# Patient Record
Sex: Female | Born: 1990 | Hispanic: Yes | Marital: Single | Smoking: Former smoker
Health system: Southern US, Community
[De-identification: ages and names within clinical notes are randomized; demographics above are authoritative.]

## PROBLEM LIST (undated history)

## (undated) ENCOUNTER — Inpatient Hospital Stay (HOSPITAL_COMMUNITY): Payer: Self-pay

## (undated) DIAGNOSIS — F4310 Post-traumatic stress disorder, unspecified: Secondary | ICD-10-CM

## (undated) DIAGNOSIS — F419 Anxiety disorder, unspecified: Secondary | ICD-10-CM

## (undated) DIAGNOSIS — F329 Major depressive disorder, single episode, unspecified: Secondary | ICD-10-CM

## (undated) DIAGNOSIS — F32A Depression, unspecified: Secondary | ICD-10-CM

## (undated) HISTORY — PX: SHOULDER SURGERY: SHX246

## (undated) HISTORY — PX: MULTIPLE TOOTH EXTRACTIONS: SHX2053

---

## 2017-01-14 ENCOUNTER — Inpatient Hospital Stay (HOSPITAL_COMMUNITY): Payer: Medicaid Other

## 2017-01-14 ENCOUNTER — Encounter (HOSPITAL_COMMUNITY): Payer: Self-pay

## 2017-01-14 ENCOUNTER — Inpatient Hospital Stay (HOSPITAL_COMMUNITY)
Admission: AD | Admit: 2017-01-14 | Discharge: 2017-01-15 | Disposition: A | Discharge status: Home / Self Care | Payer: Medicaid Other | Source: Ambulatory Visit | Attending: Family Medicine | Admitting: Family Medicine

## 2017-01-14 DIAGNOSIS — J219 Acute bronchiolitis, unspecified: Secondary | ICD-10-CM | POA: Insufficient documentation

## 2017-01-14 DIAGNOSIS — O99341 Other mental disorders complicating pregnancy, first trimester: Secondary | ICD-10-CM | POA: Diagnosis not present

## 2017-01-14 DIAGNOSIS — F4310 Post-traumatic stress disorder, unspecified: Secondary | ICD-10-CM | POA: Diagnosis not present

## 2017-01-14 DIAGNOSIS — E119 Type 2 diabetes mellitus without complications: Secondary | ICD-10-CM | POA: Diagnosis not present

## 2017-01-14 DIAGNOSIS — Z87891 Personal history of nicotine dependence: Secondary | ICD-10-CM | POA: Insufficient documentation

## 2017-01-14 DIAGNOSIS — O24111 Pre-existing diabetes mellitus, type 2, in pregnancy, first trimester: Secondary | ICD-10-CM | POA: Diagnosis not present

## 2017-01-14 DIAGNOSIS — Z888 Allergy status to other drugs, medicaments and biological substances status: Secondary | ICD-10-CM | POA: Insufficient documentation

## 2017-01-14 DIAGNOSIS — Z3A09 9 weeks gestation of pregnancy: Secondary | ICD-10-CM | POA: Diagnosis not present

## 2017-01-14 DIAGNOSIS — O99511 Diseases of the respiratory system complicating pregnancy, first trimester: Secondary | ICD-10-CM | POA: Diagnosis not present

## 2017-01-14 DIAGNOSIS — F329 Major depressive disorder, single episode, unspecified: Secondary | ICD-10-CM | POA: Diagnosis not present

## 2017-01-14 DIAGNOSIS — J218 Acute bronchiolitis due to other specified organisms: Secondary | ICD-10-CM

## 2017-01-14 DIAGNOSIS — Z9889 Other specified postprocedural states: Secondary | ICD-10-CM | POA: Insufficient documentation

## 2017-01-14 HISTORY — DX: Anxiety disorder, unspecified: F41.9

## 2017-01-14 HISTORY — DX: Major depressive disorder, single episode, unspecified: F32.9

## 2017-01-14 HISTORY — DX: Post-traumatic stress disorder, unspecified: F43.10

## 2017-01-14 HISTORY — DX: Depression, unspecified: F32.A

## 2017-01-14 LAB — CBC WITH DIFFERENTIAL/PLATELET
BASOS PCT: 0 %
Basophils Absolute: 0 10*3/uL (ref 0.0–0.1)
EOS ABS: 0.6 10*3/uL (ref 0.0–0.7)
Eosinophils Relative: 4 %
HCT: 40.8 % (ref 36.0–46.0)
Hemoglobin: 14.1 g/dL (ref 12.0–15.0)
Lymphocytes Relative: 25 %
Lymphs Abs: 3.6 10*3/uL (ref 0.7–4.0)
MCH: 27.8 pg (ref 26.0–34.0)
MCHC: 34.6 g/dL (ref 30.0–36.0)
MCV: 80.5 fL (ref 78.0–100.0)
MONO ABS: 0.4 10*3/uL (ref 0.1–1.0)
MONOS PCT: 2 %
NEUTROS PCT: 69 %
Neutro Abs: 9.9 10*3/uL — ABNORMAL HIGH (ref 1.7–7.7)
Platelets: 459 10*3/uL — ABNORMAL HIGH (ref 150–400)
RBC: 5.07 MIL/uL (ref 3.87–5.11)
RDW: 15.4 % (ref 11.5–15.5)
WBC: 14.5 10*3/uL — ABNORMAL HIGH (ref 4.0–10.5)

## 2017-01-14 LAB — COMPREHENSIVE METABOLIC PANEL
ALBUMIN: 3.6 g/dL (ref 3.5–5.0)
ALT: 40 U/L (ref 14–54)
ANION GAP: 8 (ref 5–15)
AST: 33 U/L (ref 15–41)
Alkaline Phosphatase: 84 U/L (ref 38–126)
BUN: 14 mg/dL (ref 6–20)
CO2: 24 mmol/L (ref 22–32)
Calcium: 10.2 mg/dL (ref 8.9–10.3)
Chloride: 104 mmol/L (ref 101–111)
Creatinine, Ser: 0.62 mg/dL (ref 0.44–1.00)
GFR calc Af Amer: >60 mL/min (ref >60)
GFR calc non Af Amer: >60 mL/min (ref >60)
GLUCOSE: 100 mg/dL — AB (ref 65–99)
POTASSIUM: 4 mmol/L (ref 3.5–5.1)
SODIUM: 136 mmol/L (ref 135–145)
TOTAL PROTEIN: 7.2 g/dL (ref 6.5–8.1)
Total Bilirubin: 0.1 mg/dL — ABNORMAL LOW (ref 0.3–1.2)

## 2017-01-14 MED ORDER — IPRATROPIUM-ALBUTEROL 0.5-2.5 (3) MG/3ML IN SOLN
3.0000 mL | Freq: Once | RESPIRATORY_TRACT | Status: AC
  Administered 2017-01-14: 3 mL via RESPIRATORY_TRACT
  Filled 2017-01-14: qty 3

## 2017-01-14 MED ORDER — IOPAMIDOL (ISOVUE-370) INJECTION 76%
100.0000 mL | Freq: Once | INTRAVENOUS | Status: AC | PRN
  Administered 2017-01-14: 100 mL via INTRAVENOUS

## 2017-01-14 NOTE — MAU Note (Signed)
Pt directly to triage with complaint of shortness of breath, states she has had a cough for the last 3 days and has felt progressively short of breath. Reports cough is productive of green sputum. Denies history of asthma. Also reports a sore throat.

## 2017-01-14 NOTE — MAU Provider Note (Signed)
History     CSN: [id number]  Arrival date and time: 01/14/17 2216   First Provider Initiated Contact with Patient 01/14/17 2227      Chief Complaint  Patient presents with  . Shortness of Breath   Denise Harmon is a 27 y.o. [phone] [id number] at Unknown who presents today with shortness of breath x 3 days. Worsening today. She states that she rode on [employer] bus [place] 5 [place]. She has been here for about 5 days at this time. The shortness of breath started 2 days after arrival here.    Shortness of Breath  This is a new problem. Episode onset: 3 days ago. Episode frequency: intermittently for 2 days, but today worsensing.  The problem has been gradually worsening. Pertinent negatives include no chest pain, fever, leg pain or leg swelling. Nothing aggravates the symptoms. Risk factors include prolonged immobilization and smoking (quit when she found out she was pregnant. ). She has tried nothing for the symptoms. (Possible allergies to dog. She is in a new house with dogs, and has been sneezing. )    Past Medical History:  Diagnosis Date  . Anxiety   . Depression   . Diabetes mellitus without complication (HCC)   . PTSD (post-traumatic stress disorder)     Past Surgical History:  Procedure Laterality Date  . CESAREAN SECTION    . MULTIPLE TOOTH EXTRACTIONS    . SHOULDER SURGERY      History reviewed. No pertinent family history.  Social History  Substance Use Topics  . Smoking status: Former Games developermoker  . Smokeless tobacco: Former NeurosurgeonUser  . Alcohol use No    Allergies:  Allergies  Allergen Reactions  . Naproxen Hives    No prescriptions prior to admission.    Review of Systems  Constitutional: Negative for chills and fever.  Respiratory: Positive for cough and shortness of breath.        Yesterday pain with breathing, but pain is less today.   Cardiovascular: Negative for chest pain and leg swelling.   Physical Exam   Blood pressure 148/75,  pulse 114, temperature 98.9 F (37.2 C), temperature source Oral, resp. rate 24, height 5\' 5"  (1.651 m), weight (!) 336 lb (152.4 kg), last menstrual period 11/15/2016, SpO2 98 %.  Physical Exam  Nursing note and vitals reviewed. Constitutional: She is oriented to person, place, and time. She appears well-developed and well-nourished. She appears distressed.  Respiratory: She is in respiratory distress. She has wheezes.  GI: There is no tenderness. There is no rebound.  Musculoskeletal: She exhibits no edema or tenderness.  Neurological: She is alert and oriented to person, place, and time.  Skin: Skin is warm and dry. No erythema.  Psychiatric: She has a normal mood and affect.   Results for orders placed or performed during the hospital encounter of 01/14/17 (from the past 24 hour(s))  CBC with Differential/Platelet     Status: Abnormal   Collection Time: 01/14/17 10:43 PM  Result Value Ref Range   WBC 14.5 (H) 4.0 - 10.5 K/uL   RBC 5.07 3.87 - 5.11 MIL/uL   Hemoglobin 14.1 12.0 - 15.0 g/dL   HCT 62.940.8 52.836.0 - 41.346.0 %   MCV 80.5 78.0 - 100.0 fL   MCH 27.8 26.0 - 34.0 pg   MCHC 34.6 30.0 - 36.0 g/dL   RDW 24.415.4 01.011.5 - 27.215.5 %   Platelets 459 (H) 150 - 400 K/uL   Neutrophils Relative % 69 %  Neutro Abs 9.9 (H) 1.7 - 7.7 K/uL   Lymphocytes Relative 25 %   Lymphs Abs 3.6 0.7 - 4.0 K/uL   Monocytes Relative 2 %   Monocytes Absolute 0.4 0.1 - 1.0 K/uL   Eosinophils Relative 4 %   Eosinophils Absolute 0.6 0.0 - 0.7 K/uL   Basophils Relative 0 %   Basophils Absolute 0.0 0.0 - 0.1 K/uL  Comprehensive metabolic panel     Status: Abnormal   Collection Time: 01/14/17 10:43 PM  Result Value Ref Range   Sodium 136 135 - 145 mmol/L   Potassium 4.0 3.5 - 5.1 mmol/L   Chloride 104 101 - 111 mmol/L   CO2 24 22 - 32 mmol/L   Glucose, Bld 100 (H) 65 - 99 mg/dL   BUN 14 6 - 20 mg/dL   Creatinine, Ser 1.61 0.44 - 1.00 mg/dL   Calcium 09.6 8.9 - 04.5 mg/dL   Total Protein 7.2 6.5 - 8.1 g/dL    Albumin 3.6 3.5 - 5.0 g/dL   AST 33 15 - 41 U/L   ALT 40 14 - 54 U/L   Alkaline Phosphatase 84 38 - 126 U/L   Total Bilirubin 0.1 (L) 0.3 - 1.2 mg/dL   GFR calc non Af Amer >60 >60 mL/min   GFR calc Af Amer >60 >60 mL/min   Anion gap 8 5 - 15  Pregnancy, urine POC     Status: Abnormal   Collection Time: 01/15/17 12:09 AM  Result Value Ref Range   Preg Test, Ur POSITIVE (A) NEGATIVE  Ct Angio Chest Pe W And/or Wo Contrast  Result Date: 01/14/2017 CLINICAL DATA:  27 y/o F; 2 months pregnant. Three days of shortness of breath. EXAM: CT ANGIOGRAPHY CHEST WITH CONTRAST TECHNIQUE: Multidetector CT imaging of the chest was performed using the standard protocol during bolus administration of intravenous contrast. Multiplanar CT image reconstructions and MIPs were obtained to evaluate the vascular anatomy. CONTRAST:  100 cc Isovue 370. COMPARISON:  None. FINDINGS: Cardiovascular: Satisfactory opacification of the pulmonary arteries to the segmental level. No evidence of pulmonary embolism. Normal heart size. No pericardial effusion. Mediastinum/Nodes: No enlarged mediastinal, hilar, or axillary lymph nodes. Thyroid gland, trachea, and esophagus demonstrate no significant findings. Lungs/Pleura: There is diffuse peribronchial thickening and patchy ground-glass opacities in the right upper lobe apical segment in a bronchovascular distribution with few additional minor ground-glass opacities in the lung bases. No pleural effusion or pneumothorax. Upper Abdomen: No acute abnormality. Musculoskeletal: No chest wall abnormality. No acute or significant osseous findings. Review of the MIP images confirms the above findings. IMPRESSION: 1. No pulmonary embolus identified. 2. Diffuse peribronchial thickening and patchy ground-glass opacities in the right upper lobe apical segment probably represents acute bronchitis with associated atelectasis or developing bronchopneumonia. Electronically Signed   By: Mitzi Hansen M.D.   On: 01/14/2017 23:44    MAU Course  Procedures  MDM  2240: DW Dr. Adrian Blackwater, will get CBC, CMET and CTA.  Patient has had breathing treatment. She reports that she is feeling a little better at this time.  0011: D/W Dr. Adrian Blackwater. Will DC home with albuterol, steroids and azithromycin.  Assessment and Plan   1. Acute bronchiolitis due to other specified organisms    DC home Comfort measures reviewed  1st Trimester precautions  RX: medrol 40mg  QD x 5 days, azithromycin as directed.  Return to MAU as needed FU with OB as planned  Follow-up Information    [facility] Follow up.   Specialty:  Urgent Care Contact information: [address] [place] [address] [phone]         Tawnya Crook 01/15/2017, 12:20 AM

## 2017-01-15 DIAGNOSIS — J218 Acute bronchiolitis due to other specified organisms: Secondary | ICD-10-CM

## 2017-01-15 LAB — URINALYSIS, ROUTINE W REFLEX MICROSCOPIC
BILIRUBIN URINE: NEGATIVE
Glucose, UA: NEGATIVE mg/dL
Hgb urine dipstick: NEGATIVE
Ketones, ur: NEGATIVE mg/dL
Nitrite: NEGATIVE
PH: 5 (ref 5.0–8.0)
Protein, ur: NEGATIVE mg/dL
SPECIFIC GRAVITY, URINE: 1.028 (ref 1.005–1.030)

## 2017-01-15 LAB — POCT PREGNANCY, URINE: Preg Test, Ur: POSITIVE — AB

## 2017-01-15 MED ORDER — AZITHROMYCIN 250 MG PO TABS: 250.0000 mg | ORAL_TABLET | Freq: Every day | ORAL | 0 refills | Status: DC

## 2017-01-15 MED ORDER — IPRATROPIUM-ALBUTEROL 20-100 MCG/ACT IN AERS
1.0000 | INHALATION_SPRAY | Freq: Once | RESPIRATORY_TRACT | Status: AC
  Administered 2017-01-15: 1 via RESPIRATORY_TRACT
  Filled 2017-01-15: qty 4

## 2017-01-15 MED ORDER — METHYLPREDNISOLONE 32 MG PO TABS
40.0000 mg | ORAL_TABLET | Freq: Once | ORAL | Status: AC
  Administered 2017-01-15: 40 mg via ORAL
  Filled 2017-01-15: qty 2

## 2017-01-15 MED ORDER — METHYLPREDNISOLONE 8 MG PO TABS: 40.0000 mg | ORAL_TABLET | Freq: Every day | ORAL | 0 refills | Status: DC

## 2017-01-15 NOTE — Discharge Instructions (Signed)
Prenatal Care [facility]    [facility]  [phone]     Phone: [phone]          [facility]                      [facility]  @[email or web address]     Phone: [phone]  Phone: [phone]         [facility]     Phone: [phone]  Phone: [phone]           [facility] @ [place]                hone: [phone]  Phone: [phone]         [facility] Dr. Francoise Ceo      Phone: [phone]  Phone: [phone]         [facility] Dept.                Phone: [phone]  [facility]   [phone]    [facility] [address] [place])          Phone: [phone] [facility]   Phone: [phone] Safe Medications in Pregnancy   Acne: Benzoyl Peroxide Salicylic Acid  Backache/Headache: Tylenol: 2 regular strength every 4 hours OR              2 Extra strength every 6 hours  Colds/Coughs/Allergies: Benadryl (alcohol free) 25 mg every 6 hours as needed Breath right strips Claritin Cepacol throat lozenges Chloraseptic throat spray Cold-Eeze- up to three times per day Cough drops, alcohol free Flonase (by prescription only) Guaifenesin Mucinex Robitussin DM (plain only, alcohol free) Saline nasal spray/drops Sudafed (pseudoephedrine) & Actifed ** use only after [redacted] weeks gestation and if you do not have high blood pressure Tylenol Vicks Vaporub Zinc lozenges Zyrtec   Constipation: Colace Ducolax suppositories Fleet enema Glycerin suppositories Metamucil Milk of magnesia Miralax Senokot Smooth move tea  Diarrhea: Kaopectate Imodium A-D  *NO pepto Bismol  Hemorrhoids: Anusol Anusol HC Preparation H Tucks  Indigestion: Tums Maalox Mylanta Zantac  Pepcid  Insomnia: Benadryl (alcohol free) 25mg  every 6 hours as needed Tylenol  PM Unisom, no Gelcaps  Leg Cramps: Tums MagGel  Nausea/Vomiting:  Bonine Dramamine Emetrol Ginger extract Sea bands Meclizine  Nausea medication to take during pregnancy:  Unisom (doxylamine succinate 25 mg tablets) Take one tablet daily at bedtime. If symptoms are not adequately controlled, the dose can be increased to a maximum recommended dose of two tablets daily (1/2 tablet in the morning, 1/2 tablet mid-afternoon and one at bedtime). Vitamin B6 100mg  tablets. Take one tablet twice a day (up to 200 mg per day).  Skin Rashes: Aveeno products Benadryl cream or 25mg  every 6 hours as needed Calamine Lotion 1% cortisone cream  Yeast infection: Gyne-lotrimin 7 Monistat 7   **If taking multiple medications, please check labels to avoid duplicating the same active ingredients **take medication as directed on the label ** Do not exceed 4000 mg of tylenol in 24 hours **Do not take medications that contain aspirin or ibuprofen      Viral Respiratory Infection A respiratory infection is an illness that affects part of the respiratory system, such as the lungs, nose, or throat. Most respiratory infections are caused by either viruses or bacteria. A respiratory infection that is caused by a  virus is called a viral respiratory infection. Common types of viral respiratory infections include:  A cold.  The flu (influenza).  A respiratory syncytial virus (RSV) infection. How do I know if I have a viral respiratory infection? Most viral respiratory infections cause:  A stuffy or runny nose.  Yellow or green nasal discharge.  A cough.  Sneezing.  Fatigue.  Achy muscles.  A sore throat.  Sweating or chills.  A fever.  A headache. How are viral respiratory infections treated? If influenza is diagnosed early, it may be treated with an antiviral medicine that shortens the length of time a person has symptoms. Symptoms of viral respiratory infections may be treated  with over-the-counter and prescription medicines, such as:  Expectorants. These make it easier to cough up mucus.  Decongestant nasal sprays. Health care providers do not prescribe antibiotic medicines for viral infections. This is because antibiotics are designed to kill bacteria. They have no effect on viruses. How do I know if I should stay home from work or school? To avoid exposing others to your respiratory infection, stay home if you have:  A fever.  A persistent cough.  A sore throat.  A runny nose.  Sneezing.  Muscles aches.  Headaches.  Fatigue.  Weakness.  Chills.  Sweating.  Nausea. Follow these instructions at home:  Rest as much as possible.  Take over-the-counter and prescription medicines only as told by your health care provider.  Drink enough fluid to keep your urine clear or pale yellow. This helps prevent dehydration and helps loosen up mucus.  Gargle with a salt-water mixture 3-4 times per day or as needed. To make a salt-water mixture, completely dissolve -1 tsp of salt in 1 cup of warm water.  Use nose drops made from salt water to ease congestion and soften raw skin around your nose.  Do not drink alcohol.  Do not use tobacco products, including cigarettes, chewing tobacco, and e-cigarettes. If you need help quitting, ask your health care provider. Contact a health care provider if:  Your symptoms last for 10 days or longer.  Your symptoms get worse over time.  You have a fever.  You have severe sinus pain in your face or forehead.  The glands in your jaw or neck become very swollen. Get help right away if:  You feel pain or pressure in your chest.  You have shortness of breath.  You faint or feel like you will faint.  You have severe and persistent vomiting.  You feel confused or disoriented. This information is not intended to replace advice given to you by your health care provider. Make sure you discuss any questions  you have with your health care provider. Document Released: 09/26/2005 Document Revised: 05/24/2016 Document Reviewed: 05/25/2015 Elsevier Interactive Patient Education  2017 [employer]

## 2017-01-23 ENCOUNTER — Inpatient Hospital Stay (HOSPITAL_COMMUNITY)
Admission: EM | Admit: 2017-01-23 | Discharge: 2017-01-29 | DRG: 781 | Disposition: A | Discharge status: Home / Self Care | Payer: Medicaid Other | Attending: Internal Medicine | Admitting: Internal Medicine

## 2017-01-23 ENCOUNTER — Emergency Department (HOSPITAL_COMMUNITY): Payer: Medicaid Other

## 2017-01-23 DIAGNOSIS — E872 Acidosis: Secondary | ICD-10-CM | POA: Diagnosis present

## 2017-01-23 DIAGNOSIS — O365910 Maternal care for other known or suspected poor fetal growth, first trimester, not applicable or unspecified: Secondary | ICD-10-CM | POA: Diagnosis present

## 2017-01-23 DIAGNOSIS — Z3A11 11 weeks gestation of pregnancy: Secondary | ICD-10-CM

## 2017-01-23 DIAGNOSIS — K5900 Constipation, unspecified: Secondary | ICD-10-CM | POA: Diagnosis present

## 2017-01-23 DIAGNOSIS — O98811 Other maternal infectious and parasitic diseases complicating pregnancy, first trimester: Principal | ICD-10-CM | POA: Diagnosis present

## 2017-01-23 DIAGNOSIS — R58 Hemorrhage, not elsewhere classified: Secondary | ICD-10-CM | POA: Diagnosis not present

## 2017-01-23 DIAGNOSIS — O99211 Obesity complicating pregnancy, first trimester: Secondary | ICD-10-CM | POA: Diagnosis present

## 2017-01-23 DIAGNOSIS — O24419 Gestational diabetes mellitus in pregnancy, unspecified control: Secondary | ICD-10-CM | POA: Diagnosis present

## 2017-01-23 DIAGNOSIS — Z825 Family history of asthma and other chronic lower respiratory diseases: Secondary | ICD-10-CM

## 2017-01-23 DIAGNOSIS — A419 Sepsis, unspecified organism: Secondary | ICD-10-CM

## 2017-01-23 DIAGNOSIS — J189 Pneumonia, unspecified organism: Secondary | ICD-10-CM | POA: Diagnosis present

## 2017-01-23 DIAGNOSIS — E876 Hypokalemia: Secondary | ICD-10-CM | POA: Diagnosis present

## 2017-01-23 DIAGNOSIS — Z6841 Body Mass Index (BMI) 40.0 and over, adult: Secondary | ICD-10-CM | POA: Diagnosis not present

## 2017-01-23 DIAGNOSIS — J40 Bronchitis, not specified as acute or chronic: Secondary | ICD-10-CM | POA: Diagnosis not present

## 2017-01-23 DIAGNOSIS — O30041 Twin pregnancy, dichorionic/diamniotic, first trimester: Secondary | ICD-10-CM | POA: Diagnosis present

## 2017-01-23 DIAGNOSIS — J9601 Acute respiratory failure with hypoxia: Secondary | ICD-10-CM | POA: Diagnosis present

## 2017-01-23 DIAGNOSIS — Z3490 Encounter for supervision of normal pregnancy, unspecified, unspecified trimester: Secondary | ICD-10-CM

## 2017-01-23 DIAGNOSIS — D6489 Other specified anemias: Secondary | ICD-10-CM | POA: Diagnosis present

## 2017-01-23 DIAGNOSIS — O99011 Anemia complicating pregnancy, first trimester: Secondary | ICD-10-CM | POA: Diagnosis present

## 2017-01-23 DIAGNOSIS — E669 Obesity, unspecified: Secondary | ICD-10-CM | POA: Diagnosis present

## 2017-01-23 DIAGNOSIS — O99511 Diseases of the respiratory system complicating pregnancy, first trimester: Secondary | ICD-10-CM | POA: Diagnosis present

## 2017-01-23 LAB — BASIC METABOLIC PANEL
Anion gap: 7 (ref 5–15)
BUN: 9 mg/dL (ref 6–20)
CALCIUM: 8.6 mg/dL — AB (ref 8.9–10.3)
CO2: 22 mmol/L (ref 22–32)
Chloride: 105 mmol/L (ref 101–111)
Creatinine, Ser: 0.54 mg/dL (ref 0.44–1.00)
Glucose, Bld: 125 mg/dL — ABNORMAL HIGH (ref 65–99)
Potassium: 3.4 mmol/L — ABNORMAL LOW (ref 3.5–5.1)
Sodium: 134 mmol/L — ABNORMAL LOW (ref 135–145)

## 2017-01-23 LAB — CBC
HEMATOCRIT: 36.8 % (ref 36.0–46.0)
Hemoglobin: 12.2 g/dL (ref 12.0–15.0)
MCH: 26.8 pg (ref 26.0–34.0)
MCHC: 33.2 g/dL (ref 30.0–36.0)
MCV: 80.7 fL (ref 78.0–100.0)
PLATELETS: 354 10*3/uL (ref 150–400)
RBC: 4.56 MIL/uL (ref 3.87–5.11)
RDW: 15.1 % (ref 11.5–15.5)
WBC: 22 10*3/uL — AB (ref 4.0–10.5)

## 2017-01-23 LAB — I-STAT TROPONIN, ED: Troponin i, poc: 0 ng/mL (ref 0.00–0.08)

## 2017-01-23 LAB — I-STAT BETA HCG BLOOD, ED (MC, WL, AP ONLY): I-stat hCG, quantitative: >2000 m[IU]/mL — ABNORMAL HIGH (ref <5)

## 2017-01-23 MED ORDER — DEXTROSE 5 % IV SOLN
1.0000 g | Freq: Once | INTRAVENOUS | Status: AC
  Administered 2017-01-23: 1 g via INTRAVENOUS
  Filled 2017-01-23: qty 10

## 2017-01-23 MED ORDER — METHYLPREDNISOLONE SODIUM SUCC 125 MG IJ SOLR
60.0000 mg | Freq: Once | INTRAMUSCULAR | Status: AC
  Administered 2017-01-23: 60 mg via INTRAVENOUS
  Filled 2017-01-23: qty 2

## 2017-01-23 MED ORDER — SODIUM CHLORIDE 0.9 % IV BOLUS (SEPSIS)
1000.0000 mL | Freq: Once | INTRAVENOUS | Status: AC
  Administered 2017-01-23: 1000 mL via INTRAVENOUS

## 2017-01-23 MED ORDER — ALBUTEROL (5 MG/ML) CONTINUOUS INHALATION SOLN
10.0000 mg/h | INHALATION_SOLUTION | RESPIRATORY_TRACT | Status: DC
  Administered 2017-01-23: 10 mg/h via RESPIRATORY_TRACT

## 2017-01-23 MED ORDER — ALBUTEROL SULFATE (2.5 MG/3ML) 0.083% IN NEBU
5.0000 mg | INHALATION_SOLUTION | Freq: Once | RESPIRATORY_TRACT | Status: AC
  Administered 2017-01-23: 5 mg via RESPIRATORY_TRACT
  Filled 2017-01-23: qty 6

## 2017-01-23 MED ORDER — ALBUTEROL (5 MG/ML) CONTINUOUS INHALATION SOLN
INHALATION_SOLUTION | RESPIRATORY_TRACT | Status: AC
  Filled 2017-01-23: qty 20

## 2017-01-23 MED ORDER — SODIUM CHLORIDE 0.9 % IV SOLN
INTRAVENOUS | Status: DC
  Administered 2017-01-24 (×2): via INTRAVENOUS
  Administered 2017-01-25: 100 mL/h via INTRAVENOUS

## 2017-01-23 NOTE — ED Provider Notes (Signed)
[facility]-EMERGENCY DEPT Provider Note   CSN: [id number] Arrival date & time: 01/23/17  1943     History   Chief Complaint Chief Complaint  Patient presents with  . Shortness of Breath    HPI Denise Harmon is a 27 y.o. female.  HPI Patient presents with shortness of breath. About a week ago [facility] Had a CT angiography at that time since she is around [redacted] weeks pregnant. Did not fill the antibiotics for the steroids. Had never gotten a whole lot better.last night got worse. Has had mild asthma in the past but never had to come to the hospital for it. Has had a cough with some green sputum. Slight chest pain. Has had myalgias. No swelling in her legs.   Past Medical History:  Diagnosis Date  . Anxiety   . Depression   . Diabetes mellitus without complication (HCC)   . PTSD (post-traumatic stress disorder)     There are no active problems to display for this patient.   Past Surgical History:  Procedure Laterality Date  . CESAREAN SECTION    . MULTIPLE TOOTH EXTRACTIONS    . SHOULDER SURGERY      OB History    Gravida Para Term Preterm AB Living   5 3 3   1 3    SAB TAB Ectopic Multiple Live Births   1       3       Home Medications    Prior to Admission medications   Medication Sig Start Date End Date Taking? Authorizing Provider  albuterol (PROVENTIL HFA;VENTOLIN HFA) 108 (90 Base) MCG/ACT inhaler Inhale 1-2 puffs into the lungs every 6 (six) hours as needed for wheezing or shortness of breath.   Yes Historical Provider, MD  guaiFENesin-dextromethorphan (ROBITUSSIN DM) 100-10 MG/5ML syrup Take 15 mLs by mouth every 4 (four) hours as needed for cough.   Yes Historical Provider, MD  Multiple Vitamins-Minerals (ADULT GUMMY) CHEW Chew 2 tablets by mouth daily.   Yes Historical Provider, MD  azithromycin (ZITHROMAX) 250 MG tablet Take 1 tablet (250 mg total) by mouth daily. Take first 2 tablets together, then 1 every day  until finished. Patient not taking: Reported on 01/23/2017 01/15/17   Armando ReichertHeather D Hogan, CNM  methylPREDNISolone (MEDROL) 8 MG tablet Take 5 tablets (40 mg total) by mouth daily. For 5 days Patient not taking: Reported on 01/23/2017 01/15/17   Armando ReichertHeather D Hogan, CNM    Family History No family history on file.  Social History Social History  Substance Use Topics  . Smoking status: Former Games developermoker  . Smokeless tobacco: Former NeurosurgeonUser  . Alcohol use No     Allergies   Naproxen   Review of Systems Review of Systems  Constitutional: Negative for appetite change.  HENT: Negative for congestion.   Respiratory: Positive for cough, shortness of breath and wheezing.   Cardiovascular: Positive for chest pain.  Gastrointestinal: Negative for abdominal pain.  Genitourinary: Negative for flank pain.  Musculoskeletal: Negative for back pain.  Skin: Negative for wound.  Neurological: Negative for weakness and numbness.  Hematological: Negative for adenopathy.  Psychiatric/Behavioral: Negative for confusion.     Physical Exam Updated Vital Signs BP (!) 136/54 (BP Location: Right Arm)   Pulse (!) 129   Temp 98.4 F (36.9 C) (Oral)   Resp (!) 34   LMP 11/15/2016   SpO2 91%   Physical Exam  Constitutional: She appears well-developed.  HENT:  Head: Normocephalic.  Eyes: Pupils are equal, round, and reactive to light.  Neck: No JVD present.  Cardiovascular:  Tachycardia  Pulmonary/Chest:  Diffuse harsh wheezes. Prolonged expirations. Mild respiratory distress.  Abdominal: Soft. She exhibits mass.  Musculoskeletal: She exhibits no edema.  Neurological: She is alert.  Skin: Skin is warm. Capillary refill takes less than 2 seconds.  Psychiatric: She has a normal mood and affect.     ED Treatments / Results  Labs (all labs ordered are listed, but only abnormal results are displayed) Labs Reviewed  CBC - Abnormal; Notable for the following:       Result Value   WBC 22.0 (*)    All  other components within normal limits  BASIC METABOLIC PANEL - Abnormal; Notable for the following:    Sodium 134 (*)    Potassium 3.4 (*)    Glucose, Bld 125 (*)    Calcium 8.6 (*)    All other components within normal limits  I-STAT BETA HCG BLOOD, ED ([facility], [facility], [facility] ONLY) - Abnormal; Notable for the following:    I-stat hCG, quantitative >2,000.0 (*)    All other components within normal limits  I-STAT TROPOININ, ED    EKG  EKG Interpretation  Date/Time:  Wednesday January 23 2017 20:16:18 EST Ventricular Rate:  133 PR Interval:    QRS Duration: 108 QT Interval:  312 QTC Calculation: 465 R Axis:   79 Text Interpretation:  Sinus tachycardia Confirmed by Rubin Payor  MD, Harrold Donath [phone]) on 01/23/2017 9:00:31 PM       Radiology Dg Chest 2 View  Result Date: 01/23/2017 CLINICAL DATA:  Initial evaluation for acute shortness of breath. EXAM: CHEST  2 VIEW COMPARISON:  Prior CT from 01/14/2017. FINDINGS: Cardiac and mediastinal silhouettes are stable in size and contour, and remain within normal limits. Lungs normally inflated. Streaky opacities with medial right upper lobe seen, similar to previous, which may reflect atelectasis and/ or scarring. Superimposed persistent pneumonia not excluded. No other focal airspace disease. No pulmonary edema or pleural effusion. No pneumothorax. No acute osseous abnormality. IMPRESSION: Persistent streaky opacity within the medial right upper lobe, which may reflect atelectasis and/ or scarring. Superimposed bronchopneumonia is not excluded, and could be considered in the correct clinical setting. Electronically Signed   By: Rise Mu M.D.   On: 01/23/2017 21:56    Procedures Procedures (including critical care time)  Medications Ordered in ED Medications  albuterol (PROVENTIL,VENTOLIN) solution continuous neb (10 mg/hr Nebulization New Bag/Given 01/23/17 2009)  albuterol (PROVENTIL, VENTOLIN) (5 MG/ML) 0.5% continuous inhalation solution (   Not Given 01/23/17 2009)  albuterol (PROVENTIL) (2.5 MG/3ML) 0.083% nebulizer solution 5 mg (not administered)  cefTRIAXone (ROCEPHIN) 1 g in dextrose 5 % 50 mL IVPB (1 g Intravenous New Bag/Given 01/23/17 2255)  sodium chloride 0.9 % bolus 1,000 mL (1,000 mLs Intravenous New Bag/Given 01/23/17 2253)  methylPREDNISolone sodium succinate (SOLU-MEDROL) 125 mg/2 mL injection 60 mg (60 mg Intravenous Given 01/23/17 2049)     Initial Impression / Assessment and Plan / ED Course  I have reviewed the triage vital signs and the nursing notes.  Pertinent labs & imaging results that were available during my care of the patient were reviewed by me and considered in my medical decision making (see chart for details).     Patient with shortness of breath. Seen at [facility] for the same previously and diagnosed with bronchitis. Do not get steroids or antibiotics filled. Now worsening symptoms. X-ray shows possible pneumonia. Continued dyspnea and tachycardia  will admit to internal medicine. Still dyspneic.  CRITICAL CARE Performed by: Billee Cashing Total critical care time30 minutes Critical care time was exclusive of separately billable procedures and treating other patients. Critical care was necessary to treat or prevent imminent or life-threatening deterioration. Critical care was time spent personally by me on the following activities: development of treatment plan with patient and/or surrogate as well as nursing, discussions with consultants, evaluation of patient's response to treatment, examination of patient, obtaining history from patient or surrogate, ordering and performing treatments and interventions, ordering and review of laboratory studies, ordering and review of radiographic studies, pulse oximetry and re-evaluation of patient's condition.   Final Clinical Impressions(s) / ED Diagnoses   Final diagnoses:  Community acquired pneumonia, unspecified laterality  Pregnancy,  unspecified gestational age    [place] Prescriptions New Prescriptions   No medications on file     Benjiman Core, MD 01/23/17 2258

## 2017-01-23 NOTE — H&P (Signed)
History and Physical    Denise Harmon ZOX:[id number] DOB: 07-07-1990 DOA: 01/23/2017  Referring MD/NP/PA:   PCP: No primary care provider on file.   Patient coming from:  The patient is coming from home.  At baseline, pt is independent for most of ADL.  Chief Complaint: Shortness of breath, cough, wheezing  HPI: Denise Harmon is a 27 y.o. female with medical history significant of 11 week pregnancy, gestational diabetes mellitus, depression, anxiety, who presents with shortness of breath, wheezing and cough.  Patient states that she has been having cough, shortness of breath and wheezing for 8 days. She was seen one week ago, and had negative CT angiogram for PE. She was diagnosed as bronchitis, and given prescription for steroid azithromycin. Patient has not picked up the prescription due to lack of insurance. Her symptom has been progressively getting worse. She coughs up greenish colored sputum. She has pleuritic chest pain, which is located in front chest, constant, 6 out of 10 in severity, radiating to the shoulders and the neck. Patient has nausea, but no vomiting, diarrhea or abdominal pain. Patient is [redacted] weeks pregnant, no vaginal bleeding or discharge. No symptoms of UTI or unilateral weakness.  ED Course: pt was found to have WBC 22, lactic acid 3.8, potassium is 3.4, creatinine normal, negative troponin, negative flu PCR, temperature 98.4, tachycardia, tachypnea, oxygen saturation 91% on room air. Chest x-ray showed streaks opacity. Pt is admitted to [facility] as inpt. Pt was given one dose of tamiflu before Flu PCR result came back negative.  Review of Systems:   General: no fevers, chills, no changes in body weight, has poor appetite, has fatigue HEENT: no blurry vision, hearing changes or sore throat Respiratory: has dyspnea, coughing, wheezing CV: no chest pain, no palpitations GI: has nausea, no vomiting, abdominal pain, diarrhea, constipation GU: no dysuria,  burning on urination, increased urinary frequency, hematuria  Ext: no leg edema Neuro: no unilateral weakness, numbness, or tingling, no vision change or hearing loss Skin: no rash, no skin tear. MSK: No muscle spasm, no deformity, no limitation of range of movement in spin Heme: No easy bruising.  Travel history: No recent long distant travel.  Allergy:  Allergies  Allergen Reactions  . Naproxen Hives    Past Medical History:  Diagnosis Date  . Anxiety   . Depression   . Diabetes mellitus without complication (HCC)   . PTSD (post-traumatic stress disorder)     Past Surgical History:  Procedure Laterality Date  . CESAREAN SECTION    . MULTIPLE TOOTH EXTRACTIONS    . SHOULDER SURGERY      Social History:  reports that she has quit smoking. She has quit using smokeless tobacco. She reports that she does not drink alcohol or use drugs.  Family History:  Family History  Problem Relation Age of Onset  . Diabetes Mellitus II Mother   . Diabetes Mellitus II Father   . Hypertension Father   . Asthma Sister      Prior to Admission medications   Medication Sig Start Date End Date Taking? Authorizing Provider  albuterol (PROVENTIL HFA;VENTOLIN HFA) 108 (90 Base) MCG/ACT inhaler Inhale 1-2 puffs into the lungs every 6 (six) hours as needed for wheezing or shortness of breath.   Yes Historical Provider, MD  guaiFENesin-dextromethorphan (ROBITUSSIN DM) 100-10 MG/5ML syrup Take 15 mLs by mouth every 4 (four) hours as needed for cough.   Yes Historical Provider, MD  Multiple Vitamins-Minerals (ADULT GUMMY) CHEW Chew 2 tablets  by mouth daily.   Yes Historical Provider, MD  azithromycin (ZITHROMAX) 250 MG tablet Take 1 tablet (250 mg total) by mouth daily. Take first 2 tablets together, then 1 every day until finished. Patient not taking: Reported on 01/23/2017 01/15/17   Armando ReichertHeather D Hogan, CNM  methylPREDNISolone (MEDROL) 8 MG tablet Take 5 tablets (40 mg total) by mouth daily. For 5  days Patient not taking: Reported on 01/23/2017 01/15/17   Armando ReichertHeather D Hogan, CNM    Physical Exam: Vitals:   01/24/17 0439 01/24/17 0445 01/24/17 0530 01/24/17 0533  BP:  140/71 (!) 129/51 122/69  Pulse:  107 101 100  Resp:    18  Temp:      TempSrc:      SpO2: 96% 93% 91% 93%   General: Not in acute distress HEENT:       Eyes: PERRL, EOMI, no scleral icterus.       ENT: No discharge from the ears and nose, no pharynx injection, no tonsillar enlargement.        Neck: No JVD, no bruit, no mass felt. Heme: No neck lymph node enlargement. Cardiac: S1/S2, RRR, No murmurs, No gallops or rubs. Respiratory: Has wheezing bilaterally. No rales or rubs. GI: Soft, nondistended, nontender, no rebound pain, no organomegaly, BS present. GU: No hematuria Ext: No pitting leg edema bilaterally. 2+DP/PT pulse bilaterally. Musculoskeletal: No joint deformities, No joint redness or warmth, no limitation of ROM in spin. Skin: No rashes.  Neuro: Alert, oriented X3, cranial nerves II-XII grossly intact, moves all extremities normally.  Psych: Patient is not psychotic, no suicidal or hemocidal ideation.  Labs on Admission: I have personally reviewed following labs and imaging studies  CBC:  Recent Labs Lab 01/23/17 1958  WBC 22.0*  HGB 12.2  HCT 36.8  MCV 80.7  PLT 354   Basic Metabolic Panel:  Recent Labs Lab 01/23/17 1958  NA 134*  K 3.4*  CL 105  CO2 22  GLUCOSE 125*  BUN 9  CREATININE 0.54  CALCIUM 8.6*   GFR: Estimated Creatinine Clearance: 163 mL/min (by C-G formula based on SCr of 0.54 mg/dL). Liver Function Tests: No results for input(s): AST, ALT, ALKPHOS, BILITOT, PROT, ALBUMIN in the last 168 hours. No results for input(s): LIPASE, AMYLASE in the last 168 hours. No results for input(s): AMMONIA in the last 168 hours. Coagulation Profile:  Recent Labs Lab 01/23/17 2355  INR 1.03   Cardiac Enzymes: No results for input(s): CKTOTAL, CKMB, CKMBINDEX, TROPONINI in  the last 168 hours. BNP (last 3 results) No results for input(s): PROBNP in the last 8760 hours. HbA1C: No results for input(s): HGBA1C in the last 72 hours. CBG: No results for input(s): GLUCAP in the last 168 hours. Lipid Profile: No results for input(s): CHOL, HDL, LDLCALC, TRIG, CHOLHDL, LDLDIRECT in the last 72 hours. Thyroid Function Tests: No results for input(s): TSH, T4TOTAL, FREET4, T3FREE, THYROIDAB in the last 72 hours. Anemia Panel: No results for input(s): VITAMINB12, FOLATE, FERRITIN, TIBC, IRON, RETICCTPCT in the last 72 hours. Urine analysis:    Component Value Date/Time   COLORURINE YELLOW 01/14/2017 2230   APPEARANCEUR HAZY (A) 01/14/2017 2230   LABSPEC 1.028 01/14/2017 2230   PHURINE 5.0 01/14/2017 2230   GLUCOSEU NEGATIVE 01/14/2017 2230   HGBUR NEGATIVE 01/14/2017 2230   BILIRUBINUR NEGATIVE 01/14/2017 2230   KETONESUR NEGATIVE 01/14/2017 2230   PROTEINUR NEGATIVE 01/14/2017 2230   NITRITE NEGATIVE 01/14/2017 2230   LEUKOCYTESUR SMALL (A) 01/14/2017 2230   Sepsis Labs: @  LABRCNTIP(procalcitonin:4,lacticidven:4) )No results found for this or any previous visit (from the past 240 hour(s)).   Radiological Exams on Admission: Dg Chest 2 View  Result Date: 01/23/2017 CLINICAL DATA:  Initial evaluation for acute shortness of breath. EXAM: CHEST  2 VIEW COMPARISON:  Prior CT from 01/14/2017. FINDINGS: Cardiac and mediastinal silhouettes are stable in size and contour, and remain within normal limits. Lungs normally inflated. Streaky opacities with medial right upper lobe seen, similar to previous, which may reflect atelectasis and/ or scarring. Superimposed persistent pneumonia not excluded. No other focal airspace disease. No pulmonary edema or pleural effusion. No pneumothorax. No acute osseous abnormality. IMPRESSION: Persistent streaky opacity within the medial right upper lobe, which may reflect atelectasis and/ or scarring. Superimposed bronchopneumonia is not  excluded, and could be considered in the correct clinical setting. Electronically Signed   By: Rise Mu M.D.   On: 01/23/2017 21:56     EKG: Independently reviewed.  Sinus tachycardia, QTC 465, no ischemic changes.  Assessment/Plan Principal Problem:   Acute respiratory failure with hypoxia (HCC) Active Problems:   CAP (community acquired pneumonia)   Bronchitis   Pregnancy   Hypokalemia   Sepsis (HCC)   Acute respiratory failure with hypoxia due to bronchitis versus CPAP and sepsis. Patient is septic with leukocytosis, elevated lactate, tachypnea, tachycardia. Currently hemodynamically stable. Flu PCR negative.   - Will admit to [facility] as inpt - IV Rocephin and azithromycin - Mucinex for cough  - prn Albuterol Nebs, atrovent for SOB - SoluMedrol 60 mg tid - Urine legionella and S. pneumococcal antigen - Follow up blood culture x2, sputum culture. - will get Procalcitonin and trend lactic acid level per sepsis protocol - IVF: 3L of NS bolus in ED, followed by 100 mL per hour of NS   Pregnancy: 11 week. No alarming symptoms or signs for abortion - Continue fetal heart rate monitoring every shift. - Prenatal vitamins  Hypokalemia: K=3.4 on admission. - Repleted   DVT ppx:  SQ Lovenox Code Status: Full code Family Communication: None at bed side.   Disposition Plan:  Anticipate discharge back to previous home environment Consults called:  none Admission status: SDU/inpation       Date of Service 01/24/2017    Lorretta Harp [facility] Pager [phone]  If 7PM-7AM, please contact night-coverage [email or web address] Password [facility] 01/24/2017, 6:19 AM

## 2017-01-23 NOTE — ED Triage Notes (Signed)
Per EMS pt started having shortness of breath around 3am and has been using her rescue inhaler without relief  Pt called EMS around 7pm  Upon arrival pt was having resp distress and using her accessory muscles to breathe  Pt had wheezing in all lobes  Pt was sen and diagnosed with bronchitis a week ago but was unable to get her prescriptions filled due to insurance problems  Pt was given albuterol 10mg /atrovent 0.5mg  and solumedrol 125mg  by EMS prior to arrival

## 2017-01-24 ENCOUNTER — Encounter (HOSPITAL_COMMUNITY): Payer: Self-pay | Admitting: Internal Medicine

## 2017-01-24 DIAGNOSIS — E876 Hypokalemia: Secondary | ICD-10-CM | POA: Diagnosis present

## 2017-01-24 DIAGNOSIS — J40 Bronchitis, not specified as acute or chronic: Secondary | ICD-10-CM | POA: Diagnosis present

## 2017-01-24 DIAGNOSIS — Z3490 Encounter for supervision of normal pregnancy, unspecified, unspecified trimester: Secondary | ICD-10-CM

## 2017-01-24 DIAGNOSIS — A419 Sepsis, unspecified organism: Secondary | ICD-10-CM

## 2017-01-24 DIAGNOSIS — J9601 Acute respiratory failure with hypoxia: Secondary | ICD-10-CM

## 2017-01-24 LAB — EXPECTORATED SPUTUM ASSESSMENT W GRAM STAIN, RFLX TO RESP C

## 2017-01-24 LAB — PROTIME-INR
INR: 1.03
Prothrombin Time: 13.5 seconds (ref 11.4–15.2)

## 2017-01-24 LAB — LACTIC ACID, PLASMA
LACTIC ACID, VENOUS: 1 mmol/L (ref 0.5–1.9)
LACTIC ACID, VENOUS: 3.4 mmol/L — AB (ref 0.5–1.9)
LACTIC ACID, VENOUS: 3.8 mmol/L — AB (ref 0.5–1.9)

## 2017-01-24 LAB — PROCALCITONIN: Procalcitonin: <0.1 ng/mL

## 2017-01-24 LAB — APTT: aPTT: 22 seconds — ABNORMAL LOW (ref 24–36)

## 2017-01-24 LAB — EXPECTORATED SPUTUM ASSESSMENT W REFEX TO RESP CULTURE

## 2017-01-24 LAB — INFLUENZA PANEL BY PCR (TYPE A & B)
Influenza A By PCR: NEGATIVE
Influenza B By PCR: NEGATIVE

## 2017-01-24 LAB — HIV ANTIBODY (ROUTINE TESTING W REFLEX): HIV Screen 4th Generation wRfx: NONREACTIVE

## 2017-01-24 LAB — GLUCOSE, CAPILLARY: GLUCOSE-CAPILLARY: 130 mg/dL — AB (ref 65–99)

## 2017-01-24 LAB — STREP PNEUMONIAE URINARY ANTIGEN: STREP PNEUMO URINARY ANTIGEN: NEGATIVE

## 2017-01-24 LAB — MRSA PCR SCREENING: MRSA BY PCR: NEGATIVE

## 2017-01-24 MED ORDER — LEVALBUTEROL HCL 1.25 MG/0.5ML IN NEBU
INHALATION_SOLUTION | RESPIRATORY_TRACT | Status: AC
Start: 2017-01-24 — End: 2017-01-24
  Filled 2017-01-24: qty 0.5

## 2017-01-24 MED ORDER — METHYLPREDNISOLONE SODIUM SUCC 125 MG IJ SOLR
60.0000 mg | Freq: Three times a day (TID) | INTRAMUSCULAR | Status: DC
  Administered 2017-01-24 – 2017-01-26 (×7): 60 mg via INTRAVENOUS
  Filled 2017-01-24 (×7): qty 2

## 2017-01-24 MED ORDER — DEXTROSE 5 % IV SOLN
500.0000 mg | INTRAVENOUS | Status: DC
  Administered 2017-01-24 – 2017-01-28 (×5): 500 mg via INTRAVENOUS
  Filled 2017-01-24 (×6): qty 500

## 2017-01-24 MED ORDER — CENTRUM PO CHEW
2.0000 | CHEWABLE_TABLET | Freq: Every day | ORAL | Status: DC
  Administered 2017-01-24 – 2017-01-28 (×4): 2 via ORAL
  Filled 2017-01-24 (×7): qty 2

## 2017-01-24 MED ORDER — CEFTRIAXONE SODIUM 1 G IJ SOLR
1.0000 g | INTRAMUSCULAR | Status: DC
  Administered 2017-01-24 – 2017-01-27 (×4): 1 g via INTRAVENOUS
  Filled 2017-01-24 (×5): qty 10

## 2017-01-24 MED ORDER — IPRATROPIUM BROMIDE 0.02 % IN SOLN
0.5000 mg | RESPIRATORY_TRACT | Status: DC
  Administered 2017-01-24 – 2017-01-25 (×9): 0.5 mg via RESPIRATORY_TRACT
  Filled 2017-01-24 (×8): qty 2.5

## 2017-01-24 MED ORDER — ENOXAPARIN SODIUM 40 MG/0.4ML ~~LOC~~ SOLN
40.0000 mg | Freq: Every day | SUBCUTANEOUS | Status: DC
  Administered 2017-01-24 – 2017-01-27 (×4): 40 mg via SUBCUTANEOUS
  Filled 2017-01-24 (×5): qty 0.4

## 2017-01-24 MED ORDER — LEVALBUTEROL HCL 1.25 MG/0.5ML IN NEBU
1.2500 mg | INHALATION_SOLUTION | RESPIRATORY_TRACT | Status: DC
  Administered 2017-01-24 – 2017-01-25 (×7): 1.25 mg via RESPIRATORY_TRACT
  Filled 2017-01-24 (×6): qty 0.5

## 2017-01-24 MED ORDER — OSELTAMIVIR PHOSPHATE 75 MG PO CAPS
75.0000 mg | ORAL_CAPSULE | Freq: Once | ORAL | Status: AC
  Administered 2017-01-24: 75 mg via ORAL
  Filled 2017-01-24: qty 1

## 2017-01-24 MED ORDER — PROMETHAZINE HCL 25 MG/ML IJ SOLN
12.5000 mg | Freq: Once | INTRAMUSCULAR | Status: AC
  Administered 2017-01-25: 12.5 mg via INTRAVENOUS
  Filled 2017-01-24: qty 1

## 2017-01-24 MED ORDER — ACETAMINOPHEN 325 MG PO TABS
650.0000 mg | ORAL_TABLET | Freq: Four times a day (QID) | ORAL | Status: DC | PRN
  Administered 2017-01-24 – 2017-01-27 (×4): 650 mg via ORAL
  Filled 2017-01-24 (×4): qty 2

## 2017-01-24 MED ORDER — LEVALBUTEROL HCL 1.25 MG/0.5ML IN NEBU
1.2500 mg | INHALATION_SOLUTION | Freq: Four times a day (QID) | RESPIRATORY_TRACT | Status: DC
  Administered 2017-01-24: 1.25 mg via RESPIRATORY_TRACT

## 2017-01-24 MED ORDER — SODIUM CHLORIDE 0.9 % IV BOLUS (SEPSIS)
2000.0000 mL | Freq: Once | INTRAVENOUS | Status: AC
  Administered 2017-01-24: 2000 mL via INTRAVENOUS

## 2017-01-24 MED ORDER — POTASSIUM CHLORIDE 20 MEQ/15ML (10%) PO SOLN
40.0000 meq | Freq: Once | ORAL | Status: AC
  Administered 2017-01-24: 40 meq via ORAL
  Filled 2017-01-24: qty 30

## 2017-01-24 MED ORDER — DM-GUAIFENESIN ER 30-600 MG PO TB12
1.0000 | ORAL_TABLET | Freq: Two times a day (BID) | ORAL | Status: DC
  Administered 2017-01-24 – 2017-01-26 (×6): 1 via ORAL
  Filled 2017-01-24 (×8): qty 1

## 2017-01-24 MED ORDER — IPRATROPIUM BROMIDE 0.02 % IN SOLN
RESPIRATORY_TRACT | Status: AC
  Filled 2017-01-24: qty 2.5

## 2017-01-24 MED ORDER — ONDANSETRON HCL 4 MG/2ML IJ SOLN
4.0000 mg | INTRAMUSCULAR | Status: DC | PRN
  Administered 2017-01-24 – 2017-01-27 (×4): 4 mg via INTRAVENOUS
  Filled 2017-01-24 (×3): qty 2

## 2017-01-24 MED ORDER — POTASSIUM CHLORIDE 20 MEQ/15ML (10%) PO SOLN
20.0000 meq | Freq: Once | ORAL | Status: AC
  Administered 2017-01-24: 20 meq via ORAL
  Filled 2017-01-24: qty 15

## 2017-01-24 MED ORDER — ONDANSETRON HCL 4 MG/2ML IJ SOLN
4.0000 mg | Freq: Three times a day (TID) | INTRAMUSCULAR | Status: DC | PRN
  Administered 2017-01-24: 4 mg via INTRAVENOUS
  Filled 2017-01-24 (×2): qty 2

## 2017-01-24 NOTE — Progress Notes (Signed)
Received from ICU. Alert and oriented, not in distress.Agree with previous RN's assessment.

## 2017-01-24 NOTE — Progress Notes (Signed)
Patient Demographics:    Denise Harmon, is a 27 y.o. female, DOB - 04/14/1990, ZOX:[id number]  Admit date - 01/23/2017   Admitting Physician Lorretta Harp, MD  Outpatient Primary MD for the patient is No primary care provider on file.  LOS - 1   Chief Complaint  Patient presents with  . Shortness of Breath      Brief Hx- HPI: Denise Harmon is a 27 y.o. female with medical history significant of 11 week pregnancy, gestational diabetes mellitus, depression, anxiety, who presents with shortness of breath, wheezing and cough.  Patient presented with cough- greenish sputum, shortness of breath and wheezing for 8 days. Had neg CT angio negative for PE- 1 wk ago- diagnosed as bronchitis, and given prescription for steroid azithromycin. Patient has not picked up the prescription due to lack of insurance. Her symptom has been progressively getting worse. She coughs up greenish colored sputum.  On admission- WBC 22, lactic acid 3.8, negative flu PCR, tachycardia, tachypnea, oxygen saturation 91% on room air. Chest x-ray showed streaks opacity. Pt is admitted to [facility] as inpt.   Subjective:    Denise Harmon today c/o vomiting, but otherwise, reports breathing has improved. She is still wheezing.    Assessment  & Plan :    Principal Problem:   Acute respiratory failure with hypoxia (HCC) Active Problems:   CAP (community acquired pneumonia)   Bronchitis   Pregnancy   Hypokalemia   Sepsis (HCC)  Acute respiratory failure with hypoxia due to CAP and likely reactive airway dx.-  - Transfer off step down, tele - O2 nasal canula, O2 sats >94% - Family hx of asthma. -Flu neg - IV Rocephin and azithromycin - Mucinex for cough  - prn Albuterol Nebs, atrovent for SOB - SoluMedrol 60 mg tid - Urine legionella and S. pneumococcal antigen - Follow up blood culture x2, sputum culture. - Lactic acid- now  normal - IVF: 3L of NS bolus in ED, followed by 100 mL per hour of NS   Sepsis- likely 2/2 CAP- Patient is septic with leukocytosis, elevated lactate, tachypnea, tachycardia. Currently hemodynamically stable. Flu PCR negative. Tachycardia has resolved. - Hydrate - Antibiotics per above - CBC am  Pregnancy: 11 week. No alarming symptoms or signs for abortion - fetal tones could not be detected due to age of gestation, usually detected from 12 weeks. - Prenatal vitamins - preg safe meds. - Zofran 4mg  Q4H  Hypokalemia: K=3.4 on admission. - Repleted - Bmet am  Code Status : Full   Disposition Plan  : pending improvement in symptoms, no requiring O2.   Consults  :  None   DVT Prophylaxis  :  Lovenox - Heparin   Lab Results  Component Value Date   PLT 354 01/23/2017    Inpatient Medications  Scheduled Meds: . azithromycin  500 mg Intravenous Q24H  . cefTRIAXone (ROCEPHIN)  IV  1 g Intravenous Q24H  . dextromethorphan-guaiFENesin  1 tablet Oral BID  . enoxaparin (LOVENOX) injection  40 mg Subcutaneous Daily  . ipratropium      . ipratropium  0.5 mg Nebulization Q4H  . levalbuterol      . levalbuterol  1.25 mg Nebulization Q4H  . methylPREDNISolone (SOLU-MEDROL) injection  60 mg Intravenous  Q8H  . multivitamin-iron-minerals-folic acid  2 tablet Oral Daily   Continuous Infusions: . sodium chloride 100 mL/hr at 01/24/17 0310   PRN Meds:.acetaminophen, ondansetron    Anti-infectives    Start     Dose/Rate Route Frequency Ordered Stop   01/24/17 2200  cefTRIAXone (ROCEPHIN) 1 g in dextrose 5 % 50 mL IVPB     1 g 100 mL/hr over 30 Minutes Intravenous Every 24 hours 01/24/17 0036 01/31/17 2159   01/24/17 0130  oseltamivir (TAMIFLU) capsule 75 mg     75 mg Oral Once 01/24/17 0129 01/24/17 0435   01/24/17 0045  azithromycin (ZITHROMAX) 500 mg in dextrose 5 % 250 mL IVPB     500 mg 250 mL/hr over 60 Minutes Intravenous Every 24 hours 01/24/17 0036 01/31/17 0044    01/23/17 2245  cefTRIAXone (ROCEPHIN) 1 g in dextrose 5 % 50 mL IVPB     1 g 100 mL/hr over 30 Minutes Intravenous  Once 01/23/17 2234 01/23/17 2325        Objective:   Vitals:   01/24/17 1000 01/24/17 1100 01/24/17 1225 01/24/17 1227  BP: (!) 141/52   120/63  Pulse: 100 95  83  Resp: (!) 27 (!) 25  18  Temp:    99.2 F (37.3 C)  TempSrc:    Oral  SpO2: 94% 93% 94% 95%  Weight:      Height:        Wt Readings from Last 3 Encounters:  01/24/17 (!) 149.9 kg (330 lb 7.5 oz)  01/14/17 (!) 152.4 kg (336 lb)     Intake/Output Summary (Last 24 hours) at 01/24/17 1358 Last data filed at 01/24/17 1100  Gross per 24 hour  Intake           783.33 ml  Output                0 ml  Net           783.33 ml     Physical Exam  Gen:- Awake Alert,  Oriented, in no distress HEENT:- [place].AT, No sclera icterus Neck-Supple Neck,No JVD,.  Lungs-  Diffuse expiratory and inspiratory wheeze. CV- S1, S2 normal, regular Abd-  +ve B.Sounds, Abd Soft, No tenderness,   Obese. Extremity/Skin:- No  edema,  Warm,     Data Review:   Micro Results Recent Results (from the past 240 hour(s))  MRSA PCR Screening     Status: None   Collection Time: 01/24/17  6:22 AM  Result Value Ref Range Status   MRSA by PCR NEGATIVE NEGATIVE Final    Comment:        The GeneXpert MRSA Assay (FDA approved for NASAL specimens only), is one component of a comprehensive MRSA colonization surveillance program. It is not intended to diagnose MRSA infection nor to guide or monitor treatment for MRSA infections.   Culture, sputum-assessment     Status: None   Collection Time: 01/24/17  9:07 AM  Result Value Ref Range Status   Specimen Description SPUTUM  Final   Special Requests NONE  Final   Sputum evaluation THIS SPECIMEN IS ACCEPTABLE FOR SPUTUM CULTURE  Final   Report Status 01/24/2017 FINAL  Final    Radiology Reports Dg Chest 2 View  Result Date: 01/23/2017 CLINICAL DATA:  Initial evaluation for  acute shortness of breath. EXAM: CHEST  2 VIEW COMPARISON:  Prior CT from 01/14/2017. FINDINGS: Cardiac and mediastinal silhouettes are stable in size and contour, and remain within normal  limits. Lungs normally inflated. Streaky opacities with medial right upper lobe seen, similar to previous, which may reflect atelectasis and/ or scarring. Superimposed persistent pneumonia not excluded. No other focal airspace disease. No pulmonary edema or pleural effusion. No pneumothorax. No acute osseous abnormality. IMPRESSION: Persistent streaky opacity within the medial right upper lobe, which may reflect atelectasis and/ or scarring. Superimposed bronchopneumonia is not excluded, and could be considered in the correct clinical setting. Electronically Signed   By: Rise Mu M.D.   On: 01/23/2017 21:56   Ct Angio Chest Pe W And/or Wo Contrast  Result Date: 01/14/2017 CLINICAL DATA:  27 y/o F; 2 months pregnant. Three days of shortness of breath. EXAM: CT ANGIOGRAPHY CHEST WITH CONTRAST TECHNIQUE: Multidetector CT imaging of the chest was performed using the standard protocol during bolus administration of intravenous contrast. Multiplanar CT image reconstructions and MIPs were obtained to evaluate the vascular anatomy. CONTRAST:  100 cc Isovue 370. COMPARISON:  None. FINDINGS: Cardiovascular: Satisfactory opacification of the pulmonary arteries to the segmental level. No evidence of pulmonary embolism. Normal heart size. No pericardial effusion. Mediastinum/Nodes: No enlarged mediastinal, hilar, or axillary lymph nodes. Thyroid gland, trachea, and esophagus demonstrate no significant findings. Lungs/Pleura: There is diffuse peribronchial thickening and patchy ground-glass opacities in the right upper lobe apical segment in a bronchovascular distribution with few additional minor ground-glass opacities in the lung bases. No pleural effusion or pneumothorax. Upper Abdomen: No acute abnormality. Musculoskeletal:  No chest wall abnormality. No acute or significant osseous findings. Review of the MIP images confirms the above findings. IMPRESSION: 1. No pulmonary embolus identified. 2. Diffuse peribronchial thickening and patchy ground-glass opacities in the right upper lobe apical segment probably represents acute bronchitis with associated atelectasis or developing bronchopneumonia. Electronically Signed   By: Mitzi Hansen M.D.   On: 01/14/2017 23:44     CBC  Recent Labs Lab 01/23/17 1958  WBC 22.0*  HGB 12.2  HCT 36.8  PLT 354  MCV 80.7  MCH 26.8  MCHC 33.2  RDW 15.1    Chemistries   Recent Labs Lab 01/23/17 1958  NA 134*  K 3.4*  CL 105  CO2 22  GLUCOSE 125*  BUN 9  CREATININE 0.54  CALCIUM 8.6*   ------------------------------------------------------------------------------------------------------------------ No results for input(s): CHOL, HDL, LDLCALC, TRIG, CHOLHDL, LDLDIRECT in the last 72 hours.  No results found for: HGBA1C ------------------------------------------------------------------------------------------------------------------ No results for input(s): TSH, T4TOTAL, T3FREE, THYROIDAB in the last 72 hours.  Invalid input(s): FREET3 ------------------------------------------------------------------------------------------------------------------ No results for input(s): VITAMINB12, FOLATE, FERRITIN, TIBC, IRON, RETICCTPCT in the last 72 hours.  Coagulation profile  Recent Labs Lab 01/23/17 2355  INR 1.03    No results for input(s): DDIMER in the last 72 hours.  Cardiac Enzymes No results for input(s): CKMB, TROPONINI, MYOGLOBIN in the last 168 hours.  Invalid input(s): CK ------------------------------------------------------------------------------------------------------------------ No results found for: BNP   Onnie Boer M.D on 01/24/2017 at 1:58 PM  Between 7am to 7pm - Pager - [phone]  After 7pm go to [email or web address] -  password [email or web address]  [facility] -  Office  [phone]  Dragon dictation system was used to create this note, attempts have been made to correct errors, however presence of uncorrected errors is not a reflection quality of care provided

## 2017-01-24 NOTE — Progress Notes (Signed)
CSW consulted to assist pt obtaining medications at d/c. CSW is unable to assist with this request.  Please consult RNCM for assistance.  Cori RazorJamie Teralyn Mullins LCSW [phone]

## 2017-01-25 ENCOUNTER — Inpatient Hospital Stay (HOSPITAL_COMMUNITY): Payer: Medicaid Other

## 2017-01-25 LAB — URINALYSIS, ROUTINE W REFLEX MICROSCOPIC
BACTERIA UA: NONE SEEN
BILIRUBIN URINE: NEGATIVE
Glucose, UA: 50 mg/dL — AB
KETONES UR: NEGATIVE mg/dL
Leukocytes, UA: NEGATIVE
NITRITE: NEGATIVE
PROTEIN: NEGATIVE mg/dL
Specific Gravity, Urine: 1.013 (ref 1.005–1.030)
pH: 7 (ref 5.0–8.0)

## 2017-01-25 LAB — BASIC METABOLIC PANEL
Anion gap: 7 (ref 5–15)
BUN: 8 mg/dL (ref 6–20)
CHLORIDE: 108 mmol/L (ref 101–111)
CO2: 20 mmol/L — AB (ref 22–32)
Calcium: 9.2 mg/dL (ref 8.9–10.3)
Creatinine, Ser: 0.45 mg/dL (ref 0.44–1.00)
GFR calc non Af Amer: >60 mL/min (ref >60)
Glucose, Bld: 140 mg/dL — ABNORMAL HIGH (ref 65–99)
POTASSIUM: 4.3 mmol/L (ref 3.5–5.1)
Sodium: 135 mmol/L (ref 135–145)

## 2017-01-25 LAB — CBC
HEMATOCRIT: 34.1 % — AB (ref 36.0–46.0)
HEMOGLOBIN: 11.6 g/dL — AB (ref 12.0–15.0)
MCH: 27.5 pg (ref 26.0–34.0)
MCHC: 34 g/dL (ref 30.0–36.0)
MCV: 80.8 fL (ref 78.0–100.0)
Platelets: 299 10*3/uL (ref 150–400)
RBC: 4.22 MIL/uL (ref 3.87–5.11)
RDW: 15.7 % — ABNORMAL HIGH (ref 11.5–15.5)
WBC: 23.4 10*3/uL — ABNORMAL HIGH (ref 4.0–10.5)

## 2017-01-25 LAB — LEGIONELLA PNEUMOPHILA SEROGP 1 UR AG: L. pneumophila Serogp 1 Ur Ag: NEGATIVE

## 2017-01-25 MED ORDER — LEVALBUTEROL HCL 1.25 MG/0.5ML IN NEBU
1.2500 mg | INHALATION_SOLUTION | Freq: Four times a day (QID) | RESPIRATORY_TRACT | Status: DC
  Administered 2017-01-25 (×2): 1.25 mg via RESPIRATORY_TRACT
  Filled 2017-01-25 (×4): qty 0.5

## 2017-01-25 MED ORDER — DOCUSATE SODIUM 100 MG PO CAPS
100.0000 mg | ORAL_CAPSULE | Freq: Every day | ORAL | Status: DC | PRN
  Administered 2017-01-26: 100 mg via ORAL
  Filled 2017-01-25 (×2): qty 1

## 2017-01-25 MED ORDER — IPRATROPIUM BROMIDE 0.02 % IN SOLN
0.5000 mg | Freq: Three times a day (TID) | RESPIRATORY_TRACT | Status: DC
  Administered 2017-01-26 (×3): 0.5 mg via RESPIRATORY_TRACT
  Filled 2017-01-25 (×5): qty 2.5

## 2017-01-25 MED ORDER — LEVALBUTEROL HCL 1.25 MG/0.5ML IN NEBU
1.2500 mg | INHALATION_SOLUTION | Freq: Three times a day (TID) | RESPIRATORY_TRACT | Status: DC
  Administered 2017-01-26 – 2017-01-28 (×6): 1.25 mg via RESPIRATORY_TRACT
  Filled 2017-01-25 (×9): qty 0.5

## 2017-01-25 MED ORDER — IPRATROPIUM BROMIDE 0.02 % IN SOLN
0.5000 mg | Freq: Four times a day (QID) | RESPIRATORY_TRACT | Status: DC
  Administered 2017-01-25 (×2): 0.5 mg via RESPIRATORY_TRACT
  Filled 2017-01-25 (×2): qty 2.5

## 2017-01-25 MED ORDER — LEVALBUTEROL HCL 1.25 MG/0.5ML IN NEBU
1.2500 mg | INHALATION_SOLUTION | Freq: Four times a day (QID) | RESPIRATORY_TRACT | Status: DC | PRN
  Administered 2017-01-27 (×2): 1.25 mg via RESPIRATORY_TRACT
  Filled 2017-01-25 (×3): qty 0.5

## 2017-01-25 NOTE — Progress Notes (Signed)
Patient Demographics:    Denise Harmon, is a 27 y.o. female, DOB - 22-Jan-1990, UXL:[id number]  Admit date - 01/23/2017   Admitting Physician Lorretta HarpXilin Niu, MD  Outpatient Primary MD for the patient is No primary care provider on file.  LOS - 2  Chief Complaint  Patient presents with  . Shortness of Breath      Brief Hx- HPI: Denise Harmon is a 27 y.o. female with medical history significant of 11 week pregnancy, gestational diabetes mellitus, depression, anxiety, who presents with shortness of breath, wheezing and cough.  Patient presented with cough- greenish sputum, shortness of breath and wheezing for 8 days. Had neg CT angio negative for PE- 1 wk ago- diagnosed as bronchitis, and given prescription for steroid azithromycin. Patient has not picked up the prescription due to lack of insurance. Her symptom has been progressively getting worse. She coughs up greenish colored sputum.  On admission- WBC 22, lactic acid 3.8, negative flu PCR, tachycardia, tachypnea, oxygen saturation 91% on room air. Chest x-ray showed streaks opacity. Pt is admitted to [facility] as inpt.   Subjective:    Denise CommonsCristela Arai feels better today. Breathing better, especially with bronchidils. No chest pain. Called by pts nurse that pt had burning with urination with blood. Pt reports burning with urination, with pink streaks in vulva on wiping three times on tissue paper.    Assessment  & Plan :    Principal Problem:   Acute respiratory failure with hypoxia (HCC) Active Problems:   CAP (community acquired pneumonia)   Bronchitis   Pregnancy   Hypokalemia   Sepsis (HCC)  Acute respiratory failure with hypoxia due to CAP and likely reactive airway dx.- WBC- persistently elevated- 23.4- 01/25/17. Chest xray- Persistent streaky opacity within the medial right upper lobe, which may reflect atelectasis and/ or scarring.  Superimposed bronchopneumonia is not excluded, and could be considered in the correct clinical setting. -- IV Rocephin and azithromycin  O2 nasal canula, O2 sats >94% - Strong Family hx of asthma. - Flu neg - Mucinex for cough  - prn Albuterol Nebs, atrovent for SOB - SoluMedrol 60 mg tid, reduce to 40 BID - Urine legionella and S. pneumococcal antigen- both Negative - Follow up blood culture x2, sputum culture- NGTD - Lactic acidosis- resolved. - D/c IVF: 100cc/hr - If leukocytosis not trending down, consider repeat chest radiograph to check for effusion, but pt is [redacted] wks gestation, already had exposure to radiation-CT angio, 1/15. Would be very hesistant about getting addition radiation. - Consider coverage with Zosyn, considering greenish sputum and rare gram neg rods on gram stain  Sepsis- resolving, likely 2/2 CAP-leukocytosis, elevated lactate, tachypnea, tachycardia. Currently hemodynamically stable.  - Antibiotics per above - CBC am  Pregnancy: 11 week. Reported pink streaks in vulva region, with burning, already on appropriate antibiotic coverage - fetal tones could not be detected due to early gestation, usually detected from 12 weeks. - Transvaginal US, as pt is obese. - Ua,- No su[pportive of UTI, but small Hgb present. - Urine culture - Prenatal vitamins - preg safe meds. - Zofran 4mg  Q4H PRN  Hypokalemia: K=3.4 on admission. - Repleted  Code Status : Full  Disposition Plan  : pending improvement in symptoms, not requiring O2.  Consults  :  None  DVT Prophylaxis  :  Lovenox - Heparin   Lab Results  Component Value Date   PLT 299 01/25/2017    Inpatient Medications  Scheduled Meds: . azithromycin  500 mg Intravenous Q24H  . cefTRIAXone (ROCEPHIN)  IV  1 g Intravenous Q24H  . dextromethorphan-guaiFENesin  1 tablet Oral BID  . enoxaparin (LOVENOX) injection  40 mg Subcutaneous Daily  . ipratropium  0.5 mg Nebulization Q6H  . levalbuterol  1.25 mg  Nebulization Q6H  . methylPREDNISolone (SOLU-MEDROL) injection  60 mg Intravenous Q8H  . multivitamin-iron-minerals-folic acid  2 tablet Oral Daily   Continuous Infusions: . sodium chloride 100 mL/hr (01/25/17 1004)   PRN Meds:.acetaminophen, ondansetron    Anti-infectives    Start     Dose/Rate Route Frequency Ordered Stop   01/24/17 2200  cefTRIAXone (ROCEPHIN) 1 g in dextrose 5 % 50 mL IVPB     1 g 100 mL/hr over 30 Minutes Intravenous Every 24 hours 01/24/17 0036 01/31/17 2159   01/24/17 0130  oseltamivir (TAMIFLU) capsule 75 mg     75 mg Oral Once 01/24/17 0129 01/24/17 0435   01/24/17 0045  azithromycin (ZITHROMAX) 500 mg in dextrose 5 % 250 mL IVPB     500 mg 250 mL/hr over 60 Minutes Intravenous Every 24 hours 01/24/17 0036 01/31/17 0044   01/23/17 2245  cefTRIAXone (ROCEPHIN) 1 g in dextrose 5 % 50 mL IVPB     1 g 100 mL/hr over 30 Minutes Intravenous  Once 01/23/17 2234 01/23/17 2325        Objective:   Vitals:   01/25/17 0526 01/25/17 0800 01/25/17 1320 01/25/17 1342  BP: 137/67   129/82  Pulse: 92 90 88 98  Resp: 18 18 18 18   Temp: 99.1 F (37.3 C)   98.6 F (37 C)  TempSrc: Oral   Oral  SpO2: 94% 94% 95% 94%  Weight:      Height:        Wt Readings from Last 3 Encounters:  01/24/17 (!) 149.9 kg (330 lb 7.5 oz)  01/14/17 (!) 152.4 kg (336 lb)     Intake/Output Summary (Last 24 hours) at 01/25/17 1906 Last data filed at 01/25/17 1822  Gross per 24 hour  Intake          3113.34 ml  Output                0 ml  Net          3113.34 ml     Physical Exam Gen:- Awake Alert,  Oriented, in no distress, obese HEENT:- [place].AT, No sclera icterus Neck-Supple Neck,No JVD,.  Lungs-  Minimal inspiratory wheeze, no crackles, breath sounds present bilat, midlly reduced on left. CV- S1, S2 normal, regular Abd-  +ve B.Sounds, Abd Soft, No tenderness,  Obese. Extremity/Skin:- No  edema,  Warm,    Data Review:   Micro Results Recent Results (from the past 240  hour(s))  Culture, blood (x 2)     Status: None (Preliminary result)   Collection Time: 01/23/17 11:54 PM  Result Value Ref Range Status   Specimen Description BLOOD LEFT HAND  Final   Special Requests BOTTLES DRAWN AEROBIC AND ANAEROBIC  Final   Culture   Final    NO GROWTH 1 DAY Performed at [facility], [address] [place], [place] [address]    Report Status PENDING  Incomplete  Culture, blood (x 2)  Status: None (Preliminary result)   Collection Time: 01/23/17 11:55 PM  Result Value Ref Range Status   Specimen Description BLOOD RIGHT WRIST  Final   Special Requests BOTTLES DRAWN AEROBIC AND ANAEROBIC  Final   Culture   Final    NO GROWTH 1 DAY Performed at [facility], [address], [place], [place] [address]    Report Status PENDING  Incomplete  MRSA PCR Screening     Status: None   Collection Time: 01/24/17  6:22 AM  Result Value Ref Range Status   MRSA by PCR NEGATIVE NEGATIVE Final    Comment:        The GeneXpert MRSA Assay (FDA approved for NASAL specimens only), is one component of a comprehensive MRSA colonization surveillance program. It is not intended to diagnose MRSA infection nor to guide or monitor treatment for MRSA infections.   Culture, sputum-assessment     Status: None   Collection Time: 01/24/17  9:07 AM  Result Value Ref Range Status   Specimen Description SPUTUM  Final   Special Requests NONE  Final   Sputum evaluation THIS SPECIMEN IS ACCEPTABLE FOR SPUTUM CULTURE  Final   Report Status 01/24/2017 FINAL  Final  Culture, respiratory (NON-Expectorated)     Status: None (Preliminary result)   Collection Time: 01/24/17  9:07 AM  Result Value Ref Range Status   Specimen Description SPUTUM  Final   Special Requests NONE Reflexed from [id number]  Final   Gram Stain   Final    ABUNDANT WBC PRESENT,BOTH PMN AND MONONUCLEAR ABUNDANT GRAM POSITIVE COCCI MODERATE GRAM POSITIVE RODS FEW GRAM NEGATIVE RODS RARE SQUAMOUS  EPITHELIAL CELLS PRESENT    Culture   Final    CULTURE REINCUBATED FOR BETTER GROWTH Performed at [facility], [address] [place], [place] [address]    Report Status PENDING  Incomplete    Radiology Reports Dg Chest 2 View  Result Date: 01/23/2017 CLINICAL DATA:  Initial evaluation for acute shortness of breath. EXAM: CHEST  2 VIEW COMPARISON:  Prior CT from 01/14/2017. FINDINGS: Cardiac and mediastinal silhouettes are stable in size and contour, and remain within normal limits. Lungs normally inflated. Streaky opacities with medial right upper lobe seen, similar to previous, which may reflect atelectasis and/ or scarring. Superimposed persistent pneumonia not excluded. No other focal airspace disease. No pulmonary edema or pleural effusion. No pneumothorax. No acute osseous abnormality. IMPRESSION: Persistent streaky opacity within the medial right upper lobe, which may reflect atelectasis and/ or scarring. Superimposed bronchopneumonia is not excluded, and could be considered in the correct clinical setting. Electronically Signed   By: Rise Mu M.D.   On: 01/23/2017 21:56   Ct Angio Chest Pe W And/or Wo Contrast  Result Date: 01/14/2017 CLINICAL DATA:  27 y/o F; 2 months pregnant. Three days of shortness of breath. EXAM: CT ANGIOGRAPHY CHEST WITH CONTRAST TECHNIQUE: Multidetector CT imaging of the chest was performed using the standard protocol during bolus administration of intravenous contrast. Multiplanar CT image reconstructions and MIPs were obtained to evaluate the vascular anatomy. CONTRAST:  100 cc Isovue 370. COMPARISON:  None. FINDINGS: Cardiovascular: Satisfactory opacification of the pulmonary arteries to the segmental level. No evidence of pulmonary embolism. Normal heart size. No pericardial effusion. Mediastinum/Nodes: No enlarged mediastinal, hilar, or axillary lymph nodes. Thyroid gland, trachea, and esophagus demonstrate no significant findings.  Lungs/Pleura: There is diffuse peribronchial thickening and patchy ground-glass opacities in the right upper lobe apical segment in a bronchovascular  distribution with few additional minor ground-glass opacities in the lung bases. No pleural effusion or pneumothorax. Upper Abdomen: No acute abnormality. Musculoskeletal: No chest wall abnormality. No acute or significant osseous findings. Review of the MIP images confirms the above findings. IMPRESSION: 1. No pulmonary embolus identified. 2. Diffuse peribronchial thickening and patchy ground-glass opacities in the right upper lobe apical segment probably represents acute bronchitis with associated atelectasis or developing bronchopneumonia. Electronically Signed   By: Mitzi Hansen M.D.   On: 01/14/2017 23:44     CBC  Recent Labs Lab 01/23/17 1958 01/25/17 0603  WBC 22.0* 23.4*  HGB 12.2 11.6*  HCT 36.8 34.1*  PLT 354 299  MCV 80.7 80.8  MCH 26.8 27.5  MCHC 33.2 34.0  RDW 15.1 15.7*    Chemistries   Recent Labs Lab 01/23/17 1958 01/25/17 0603  NA 134* 135  K 3.4* 4.3  CL 105 108  CO2 22 20*  GLUCOSE 125* 140*  BUN 9 8  CREATININE 0.54 0.45  CALCIUM 8.6* 9.2  Coagulation profile  Recent Labs Lab 01/23/17 2355  INR 1.03    Onnie Boer M.D on 01/25/2017 at 7:06 PM  Between 7am to 7pm - Pager - [phone]  After 7pm go to [email or web address] - password [facility]  [facility] -  Office  [phone]

## 2017-01-26 DIAGNOSIS — Z3490 Encounter for supervision of normal pregnancy, unspecified, unspecified trimester: Secondary | ICD-10-CM

## 2017-01-26 DIAGNOSIS — R58 Hemorrhage, not elsewhere classified: Secondary | ICD-10-CM

## 2017-01-26 LAB — CBC
HCT: 34.1 % — ABNORMAL LOW (ref 36.0–46.0)
Hemoglobin: 11.2 g/dL — ABNORMAL LOW (ref 12.0–15.0)
MCH: 26.2 pg (ref 26.0–34.0)
MCHC: 32.8 g/dL (ref 30.0–36.0)
MCV: 79.7 fL (ref 78.0–100.0)
PLATELETS: 315 10*3/uL (ref 150–400)
RBC: 4.28 MIL/uL (ref 3.87–5.11)
RDW: 15.6 % — AB (ref 11.5–15.5)
WBC: 21 10*3/uL — ABNORMAL HIGH (ref 4.0–10.5)

## 2017-01-26 MED ORDER — METHYLPREDNISOLONE SODIUM SUCC 40 MG IJ SOLR
40.0000 mg | Freq: Every day | INTRAMUSCULAR | Status: DC
  Administered 2017-01-27: 40 mg via INTRAVENOUS
  Filled 2017-01-26: qty 1

## 2017-01-26 MED ORDER — PREDNISONE 20 MG PO TABS: 40.0000 mg | ORAL_TABLET | Freq: Every day | ORAL | Status: DC

## 2017-01-26 NOTE — Progress Notes (Signed)
Pt ambulated in hall with steady gait. O2 sat 92-94% on RA with ambulation. Pt noted to have moderate SOB and reported mild lightheadedness. Dr Criselda PeachesMullen paged to make aware.

## 2017-01-26 NOTE — Progress Notes (Signed)
PROGRESS NOTE    Willeen CassCristela Nicole Harmon  ZOX:[id number] DOB: [birth year] DOA: 01/23/2017 PCP: No primary care provider on file.    Brief Narrative:  [name] is a 26yo woman with PMH of 11 week pregnancy with twins, gestational DM, depression and anxiety who presented for wheezing, cough and SOB for 8 days.  CT angio of the lungs was negative for PE on 01/14/17 but showed possible organizing pneumonia/bronchitis.  She was given Rx for azithromycin but did not pick it up.  Her symptoms worsened and she was admitted on 1/24 for pneumonia.  Her hospital stay has been complicated by burning on urination, streaky blood loss.  She had a TVUS which showed Di/Di twins which were small for gestational age.     Assessment & Plan:   Acute respiratory failure with hypoxia due to CAP  - [name] reports improvement today, but SOB with walking to bathroom - [place] O2 discontinued at rest, on ambulation sats were in the low 90s, so likely not needed for oxygenation. She can wear for comfort today - IV rocephin and azithromycin for 1 more day, switch to PO tomorrow - She is flu negative, she has been sick for > 1 week, likely tamiflu would not be helpful at this time, though she is in a high risk category  - Mucinex for cough - levalbuterol nebs PRN - change solumedrol to 40mg  daily (will not change to prednisone, class D.  Solu medrol is class C), wean to off prior to discharge.  - Urine antigens for legionella and pneumococcus negative - I think her elevated WBC is due to high doses of steroids.  She is improving.  I would not have her undergo any more radiation at this time unless and acute decline in her clinical status occurs.    Sepsis  - 2/2 to above - She is improving, BP normal, no tachycardia - consider cutting back on fluids today - Encourage PO intake - Cultures: BC X 2 negative.  Sputum Cx was adequate, awaiting culture (GS with many bacteria), UC pending  Pregnancy with Di/Di twins - I  informed her of results of ultrasound; she reports no abdominal pain or other concerning symptoms.  - Avoid any medications contraindicated in pregnancy.  - She has had a history of gestational DM, I advised her that when she is discharged she should seek immediate OB care.   - Prenatal vitamins - She did have some blood streaking and small blood in UA (could be vaginal).  She is early in pregnancy.  Monitor for changes    Hypokalemia - Resolved.   Mild normocytic anemia - Down trending due to fluid resuscitation - consider checking iron at outpatient OB evaluation.     DVT prophylaxis: Lovenox Code Status: Full Family Communication: None at bedside Disposition Plan: Likely discharge tomorrow if she continues to improve.    Consultants:   None  Procedures:   TV US  1/26  Antimicrobials:   Azithromycin 1/25-->   Ceftriaxone 1/25 -->    Subjective: [name] is doing well.  She reports that her SOB is improved, but that she is still having issues with walking. She is on oxygen, does not use at home.  I informed her of results of her ultrasound.  She reports no belly pain, no urinary issues, no further bleeding.   Objective: Vitals:   01/25/17 1957 01/25/17 2212 01/26/17 0541 01/26/17 0807  BP:  126/72 133/69   Pulse:  77 64  Resp:  18 18   Temp:  97.5 F (36.4 C) 98 F (36.7 C)   TempSrc:  Oral Oral   SpO2: 94% 94% 94% 96%  Weight:      Height:        Intake/Output Summary (Last 24 hours) at 01/26/17 0818 Last data filed at 01/26/17 0600  Gross per 24 hour  Intake          1536.67 ml  Output                0 ml  Net          1536.67 ml   Filed Weights   01/24/17 0629  Weight: (!) 330 lb 7.5 oz (149.9 kg)    Examination:  General exam: Appears calm and comfortable.  Obese woman, lying in bed.  Somewhat dyspneic with speaking, but O2 saturation remained 92%  Eyes: Anicteric sclerae, no conjunctival injection.  Respiratory system: Decreased breath  sounds. Respiratory effort normal. No wheezing Cardiovascular system: S1 & S2 heard, RR, NR. No  murmurs. No pedal edema. Gastrointestinal system: Abdomen is soft and nontender. No organomegaly or masses felt. Normal bowel sounds heard. Central nervous system: Alert and oriented. No focal neurological deficits. Skin: No rashes, lesions or ulcers noted in extremities.  Psychiatry: Judgement and insight appear normal. Mood & affect appropriate.     Data Reviewed: I have personally reviewed following labs and imaging studies  CBC:  Recent Labs Lab 01/23/17 1958 01/25/17 0603 01/26/17 0553  WBC 22.0* 23.4* 21.0*  HGB 12.2 11.6* 11.2*  HCT 36.8 34.1* 34.1*  MCV 80.7 80.8 79.7  PLT 354 299 315   Basic Metabolic Panel:  Recent Labs Lab 01/23/17 1958 01/25/17 0603  NA 134* 135  K 3.4* 4.3  CL 105 108  CO2 22 20*  GLUCOSE 125* 140*  BUN 9 8  CREATININE 0.54 0.45  CALCIUM 8.6* 9.2   Coagulation Profile:  Recent Labs Lab 01/23/17 2355  INR 1.03   CBG:  Recent Labs Lab 01/24/17 0737  GLUCAP 130*   Sepsis Labs:  Recent Labs Lab 01/23/17 2355 01/24/17 0246 01/24/17 0811  PROCALCITON <0.10  --   --   LATICACIDVEN 3.8* 3.4* 1.0    Recent Results (from the past 240 hour(s))  Culture, blood (x 2)     Status: None (Preliminary result)   Collection Time: 01/23/17 11:54 PM  Result Value Ref Range Status   Specimen Description BLOOD LEFT HAND  Final   Special Requests BOTTLES DRAWN AEROBIC AND ANAEROBIC  Final   Culture   Final    NO GROWTH 1 DAY Performed at [facility], [address], [place], [place] [address]    Report Status PENDING  Incomplete  Culture, blood (x 2)     Status: None (Preliminary result)   Collection Time: 01/23/17 11:55 PM  Result Value Ref Range Status   Specimen Description BLOOD RIGHT WRIST  Final   Special Requests BOTTLES DRAWN AEROBIC AND ANAEROBIC  Final   Culture   Final    NO GROWTH 1 DAY Performed at [facility], [address], [place], [place] [address]    Report Status PENDING  Incomplete  MRSA PCR Screening     Status: None   Collection Time: 01/24/17  6:22 AM  Result Value Ref Range Status   MRSA by PCR NEGATIVE NEGATIVE Final    Comment:        The GeneXpert MRSA  Assay (FDA approved for NASAL specimens only), is one component of a comprehensive MRSA colonization surveillance program. It is not intended to diagnose MRSA infection nor to guide or monitor treatment for MRSA infections.   Culture, sputum-assessment     Status: None   Collection Time: 01/24/17  9:07 AM  Result Value Ref Range Status   Specimen Description SPUTUM  Final   Special Requests NONE  Final   Sputum evaluation THIS SPECIMEN IS ACCEPTABLE FOR SPUTUM CULTURE  Final   Report Status 01/24/2017 FINAL  Final  Culture, respiratory (NON-Expectorated)     Status: None (Preliminary result)   Collection Time: 01/24/17  9:07 AM  Result Value Ref Range Status   Specimen Description SPUTUM  Final   Special Requests NONE Reflexed from [id number]  Final   Gram Stain   Final    ABUNDANT WBC PRESENT,BOTH PMN AND MONONUCLEAR ABUNDANT GRAM POSITIVE COCCI MODERATE GRAM POSITIVE RODS FEW GRAM NEGATIVE RODS RARE SQUAMOUS EPITHELIAL CELLS PRESENT    Culture   Final    CULTURE REINCUBATED FOR BETTER GROWTH Performed at [facility], [address], [place], [place] [address]    Report Status PENDING  Incomplete         Radiology Studies: US Ob Comp Less 14 Wks  Result Date: 01/25/2017 CLINICAL DATA:  Acute onset of blood in the vulvar region. Initial encounter. EXAM: TWIN OBSTETRIC <14WK [place] AND TRANSVAGINAL OB US COMPARISON:  None. FINDINGS: Number of IUPs:  2 Chorionicity/Amnionicity:  Dichorionic diamniotic TWIN 1 Yolk sac:  No Embryo:  Yes Cardiac Activity: Yes Heart Rate: 180 bpm CRL:  12.3  mm   7 w 3 d                  [place] EDC: 09/10/2017 TWIN 2 Yolk sac:  No Embryo:  Yes Cardiac Activity: Yes Heart  Rate: 182 bpm CRL:  13.8  mm   7 w 5 d                  [place] EDC: 09/08/2017 Subchorionic hemorrhage:  None visualized. Maternal uterus/adnexae: The uterus is otherwise unremarkable in appearance. Evaluation is suboptimal due to the patient's habitus. The ovaries are not visualized on this study. No suspicious adnexal masses are seen. No free fluid is seen in the pelvic cul-de-sac. IMPRESSION: Live dichorionic diamniotic twin pregnancy, with crown-rump lengths of 1.2 cm and 1.4 cm, reflecting an average gestational age of [redacted] weeks 4 days. This does not match the gestational age by LMP, and reflects a new estimated date of delivery of September 09, 2017. Electronically Signed   By: Roanna Raider M.D.   On: 01/25/2017 23:36   US Ob Comp Addl Gest Less 14 Wks  Result Date: 01/25/2017 CLINICAL DATA:  Acute onset of blood in the vulvar region. Initial encounter. EXAM: TWIN OBSTETRIC <14WK [place] AND TRANSVAGINAL OB US COMPARISON:  None. FINDINGS: Number of IUPs:  2 Chorionicity/Amnionicity:  Dichorionic diamniotic TWIN 1 Yolk sac:  No Embryo:  Yes Cardiac Activity: Yes Heart Rate: 180 bpm CRL:  12.3  mm   7 w 3 d                  [place] EDC: 09/10/2017 TWIN 2 Yolk sac:  No Embryo:  Yes Cardiac Activity: Yes Heart Rate: 182 bpm CRL:  13.8  mm   7 w 5 d  [place] EDC: 09/08/2017 Subchorionic hemorrhage:  None visualized. Maternal uterus/adnexae: The uterus is otherwise unremarkable in appearance. Evaluation is suboptimal due to the patient's habitus. The ovaries are not visualized on this study. No suspicious adnexal masses are seen. No free fluid is seen in the pelvic cul-de-sac. IMPRESSION: Live dichorionic diamniotic twin pregnancy, with crown-rump lengths of 1.2 cm and 1.4 cm, reflecting an average gestational age of [redacted] weeks 4 days. This does not match the gestational age by LMP, and reflects a new estimated date of delivery of September 09, 2017. Electronically Signed   By: Roanna Raider M.D.   On: 01/25/2017  23:36   US Ob Transvaginal  Result Date: 01/25/2017 CLINICAL DATA:  Acute onset of blood in the vulvar region. Initial encounter. EXAM: TWIN OBSTETRIC <14WK [place] AND TRANSVAGINAL OB US COMPARISON:  None. FINDINGS: Number of IUPs:  2 Chorionicity/Amnionicity:  Dichorionic diamniotic TWIN 1 Yolk sac:  No Embryo:  Yes Cardiac Activity: Yes Heart Rate: 180 bpm CRL:  12.3  mm   7 w 3 d                  [place] EDC: 09/10/2017 TWIN 2 Yolk sac:  No Embryo:  Yes Cardiac Activity: Yes Heart Rate: 182 bpm CRL:  13.8  mm   7 w 5 d                  [place] EDC: 09/08/2017 Subchorionic hemorrhage:  None visualized. Maternal uterus/adnexae: The uterus is otherwise unremarkable in appearance. Evaluation is suboptimal due to the patient's habitus. The ovaries are not visualized on this study. No suspicious adnexal masses are seen. No free fluid is seen in the pelvic cul-de-sac. IMPRESSION: Live dichorionic diamniotic twin pregnancy, with crown-rump lengths of 1.2 cm and 1.4 cm, reflecting an average gestational age of [redacted] weeks 4 days. This does not match the gestational age by LMP, and reflects a new estimated date of delivery of September 09, 2017. Electronically Signed   By: Roanna Raider M.D.   On: 01/25/2017 23:36        Scheduled Meds: . azithromycin  500 mg Intravenous Q24H  . cefTRIAXone (ROCEPHIN)  IV  1 g Intravenous Q24H  . dextromethorphan-guaiFENesin  1 tablet Oral BID  . enoxaparin (LOVENOX) injection  40 mg Subcutaneous Daily  . ipratropium  0.5 mg Nebulization TID  . levalbuterol  1.25 mg Nebulization TID  . methylPREDNISolone (SOLU-MEDROL) injection  60 mg Intravenous Q8H  . multivitamin-iron-minerals-folic acid  2 tablet Oral Daily   Continuous Infusions:   LOS: 3 days    Time spent: 45 minutes    Debe Coder, MD [facility] Pager [phone]  If 7PM-7AM, please contact night-coverage [email or web address] Password [facility] 01/26/2017, 8:18 AM

## 2017-01-27 LAB — BASIC METABOLIC PANEL
Anion gap: 8 (ref 5–15)
BUN: 10 mg/dL (ref 6–20)
CO2: 25 mmol/L (ref 22–32)
Calcium: 9.5 mg/dL (ref 8.9–10.3)
Chloride: 104 mmol/L (ref 101–111)
Creatinine, Ser: 0.34 mg/dL — ABNORMAL LOW (ref 0.44–1.00)
GFR calc Af Amer: >60 mL/min (ref >60)
GFR calc non Af Amer: >60 mL/min (ref >60)
GLUCOSE: 99 mg/dL (ref 65–99)
Potassium: 3.7 mmol/L (ref 3.5–5.1)
Sodium: 137 mmol/L (ref 135–145)

## 2017-01-27 LAB — URINE CULTURE: CULTURE: NO GROWTH

## 2017-01-27 LAB — CBC
HCT: 38.7 % (ref 36.0–46.0)
Hemoglobin: 12.8 g/dL (ref 12.0–15.0)
MCH: 26.7 pg (ref 26.0–34.0)
MCHC: 33.1 g/dL (ref 30.0–36.0)
MCV: 80.6 fL (ref 78.0–100.0)
Platelets: 313 10*3/uL (ref 150–400)
RBC: 4.8 MIL/uL (ref 3.87–5.11)
RDW: 15.2 % (ref 11.5–15.5)
WBC: 17.4 10*3/uL — ABNORMAL HIGH (ref 4.0–10.5)

## 2017-01-27 LAB — CULTURE, RESPIRATORY W GRAM STAIN: Culture: NORMAL

## 2017-01-27 LAB — CULTURE, RESPIRATORY

## 2017-01-27 MED ORDER — METHYLPREDNISOLONE SODIUM SUCC 40 MG IJ SOLR
20.0000 mg | Freq: Every day | INTRAMUSCULAR | Status: DC
  Administered 2017-01-28: 20 mg via INTRAVENOUS
  Filled 2017-01-27: qty 1

## 2017-01-27 MED ORDER — SODIUM CHLORIDE 0.9 % IV SOLN
INTRAVENOUS | Status: AC
  Administered 2017-01-27: 13:00:00 via INTRAVENOUS

## 2017-01-27 NOTE — Progress Notes (Signed)
PROGRESS NOTE    Denise CassCristela Nicole Harmon  ZOX:[id number] DOB: [birth year] DOA: 01/23/2017 PCP: No primary care provider on file.    Brief Narrative:  [name] is a 27yo woman with PMH of 11 week pregnancy with twins, gestational DM, depression and anxiety who presented for wheezing, cough and SOB for 8 days.  CT angio of the lungs was negative for PE on 01/14/17 but showed possible organizing pneumonia/bronchitis.  She was given Rx for azithromycin but did not pick it up.  Her symptoms worsened and she was admitted on 1/24 for pneumonia.  Her hospital stay has been complicated by burning on urination, streaky blood loss.  She had a TVUS which showed Di/Di twins which were small for gestational age.     Assessment & Plan:   Acute respiratory failure with hypoxia due to CAP  - Continue IV abx for 1 more day given she is not feeling any better, decreased PO intake as well - STOP mucinex, not indicated in first trimester (11 weeks) - levalbuterol nebs PRN - Decrease solumedrol to 20mg  daily today (prednisone class D) - Urine antigens for legionella and pneumococcus negative - I think her elevated WBC is due to high doses of steroids, decreased today to 17.  Continue to monitor  Dizziness/nausea - I am not clear as to what is causing this, possibly related to pregnancy vs. Acute infection vs. deconditioning.  She is feeling dizzy with walking.  Also with a headache - Avoid NSAIDs as pregnant.  Consider a dose of narcotic medication if needed for headache.  - Tylenol PRN - Zofran PRN - Restart IVF for 1 L and see if this helps with nausea, she is not taking PO today - Orthostatic vitals.   Sepsis  - 2/2 to infection - Resume fluids, blood pressure stable, check orthostatic blood pressure.  - Encourage PO intake - Cultures: BC X 2 negative.  Sputum Cx was adequate, awaiting culture (GS with many bacteria), UC NGTD  Pregnancy with Di/Di twins - Avoid any medications contraindicated in  pregnancy.  - She has had a history of gestational DM, I advised her that when she is discharged she should seek immediate OB care.   - Prenatal vitamins - Bloody discharge has ceased.   Mild normocytic anemia - Down trending due to fluid resuscitation - check anemia labs with AML tomorrow.    DVT prophylaxis: Lovenox Code Status: Full Family Communication: None at bedside Disposition Plan: Possible discharge tomorrow   Consultants:   None  Procedures:   TV US  1/26  Antimicrobials:   Azithromycin 1/25-->   Ceftriaxone 1/25 -->    Subjective: Reports lightheadedness since last night with walking.  Nausea and a headache this morning, bandlike from temple to temple and over head.  She has had no vomiting.  She further feels "crummy" and has eaten this morning.  She has not had morning sickness with this pregnancy.  She had an episode of chills and sweating, no documented fever.    Objective: Vitals:   01/26/17 1944 01/26/17 2140 01/27/17 0559 01/27/17 1021  BP:  130/68 (!) 139/91   Pulse:  85 76   Resp:  18 18   Temp:  97.9 F (36.6 C) 98.3 F (36.8 C)   TempSrc:  Oral Oral   SpO2: 94% 96% 95% 94%  Weight:      Height:        Intake/Output Summary (Last 24 hours) at 01/27/17 1120 Last data filed at 01/27/17 0600  Gross per 24 hour  Intake             3350 ml  Output                0 ml  Net             3350 ml   Filed Weights   01/24/17 0629  Weight: (!) 330 lb 7.5 oz (149.9 kg)    Examination:  General exam: Appears calm and comfortable.  Obese woman, lying in bed.   Eyes: Anicteric sclerae, no conjunctival injection.  Respiratory system: Decreased breath sounds. Respiratory effort normal. No wheezing or rales Cardiovascular system: S1 & S2 heard, RR, NR. No  murmurs. Gastrointestinal system: Abdomen is soft and nontender. Uterus cannot be palpated.  +BS Central nervous system: Alert and oriented. No focal neurological deficits.  Moving all  extremities Skin: No rashes, lesions or ulcers noted in visualized extremities.  Psychiatry: Judgement and insight appear normal. She is having headache    Data Reviewed: I have personally reviewed following labs and imaging studies  CBC:  Recent Labs Lab 01/23/17 1958 01/25/17 0603 01/26/17 0553 01/27/17 0556  WBC 22.0* 23.4* 21.0* 17.4*  HGB 12.2 11.6* 11.2* 12.8  HCT 36.8 34.1* 34.1* 38.7  MCV 80.7 80.8 79.7 80.6  PLT 354 299 315 313   Basic Metabolic Panel:  Recent Labs Lab 01/23/17 1958 01/25/17 0603 01/27/17 0556  NA 134* 135 137  K 3.4* 4.3 3.7  CL 105 108 104  CO2 22 20* 25  GLUCOSE 125* 140* 99  BUN 9 8 10   CREATININE 0.54 0.45 0.34*  CALCIUM 8.6* 9.2 9.5   Coagulation Profile:  Recent Labs Lab 01/23/17 2355  INR 1.03   CBG:  Recent Labs Lab 01/24/17 0737  GLUCAP 130*   Sepsis Labs:  Recent Labs Lab 01/23/17 2355 01/24/17 0246 01/24/17 0811  PROCALCITON <0.10  --   --   LATICACIDVEN 3.8* 3.4* 1.0    Recent Results (from the past 240 hour(s))  Culture, blood (x 2)     Status: None (Preliminary result)   Collection Time: 01/23/17 11:54 PM  Result Value Ref Range Status   Specimen Description BLOOD LEFT HAND  Final   Special Requests BOTTLES DRAWN AEROBIC AND ANAEROBIC  Final   Culture   Final    NO GROWTH 2 DAYS Performed at [facility], [address], [place], [place] [address]    Report Status PENDING  Incomplete  Culture, blood (x 2)     Status: None (Preliminary result)   Collection Time: 01/23/17 11:55 PM  Result Value Ref Range Status   Specimen Description BLOOD RIGHT WRIST  Final   Special Requests BOTTLES DRAWN AEROBIC AND ANAEROBIC  Final   Culture   Final    NO GROWTH 2 DAYS Performed at [facility], [address], [place], [place] [address]    Report Status PENDING  Incomplete  MRSA PCR Screening     Status: None   Collection Time: 01/24/17  6:22 AM  Result Value Ref Range Status   MRSA  by PCR NEGATIVE NEGATIVE Final    Comment:        The GeneXpert MRSA Assay (FDA approved for NASAL specimens only), is one component of a comprehensive MRSA colonization surveillance program. It is not intended to diagnose MRSA infection nor to guide or monitor treatment for MRSA infections.   Culture, sputum-assessment     Status: None  Collection Time: 01/24/17  9:07 AM  Result Value Ref Range Status   Specimen Description SPUTUM  Final   Special Requests NONE  Final   Sputum evaluation THIS SPECIMEN IS ACCEPTABLE FOR SPUTUM CULTURE  Final   Report Status 01/24/2017 FINAL  Final  Culture, respiratory (NON-Expectorated)     Status: None   Collection Time: 01/24/17  9:07 AM  Result Value Ref Range Status   Specimen Description SPUTUM  Final   Special Requests NONE Reflexed from [id number]  Final   Gram Stain   Final    ABUNDANT WBC PRESENT,BOTH PMN AND MONONUCLEAR ABUNDANT GRAM POSITIVE COCCI MODERATE GRAM POSITIVE RODS FEW GRAM NEGATIVE RODS RARE SQUAMOUS EPITHELIAL CELLS PRESENT    Culture   Final    Consistent with normal respiratory flora. Performed at [facility], [address] [place], [place] [address]    Report Status 01/27/2017 FINAL  Final  Culture, Urine     Status: None   Collection Time: 01/25/17  6:15 PM  Result Value Ref Range Status   Specimen Description URINE, CLEAN CATCH  Final   Special Requests NONE  Final   Culture   Final    NO GROWTH Performed at [facility], [address], [place], [place] [address]    Report Status 01/27/2017 FINAL  Final     Radiology Studies: US Ob Comp Less 14 Wks  Result Date: 01/25/2017 CLINICAL DATA:  Acute onset of blood in the vulvar region. Initial encounter. EXAM: TWIN OBSTETRIC <14WK [place] AND TRANSVAGINAL OB US COMPARISON:  None. FINDINGS: Number of IUPs:  2 Chorionicity/Amnionicity:  Dichorionic diamniotic TWIN 1 Yolk sac:  No Embryo:  Yes Cardiac Activity: Yes Heart Rate: 180 bpm CRL:  12.3  mm   7  w 3 d                  [place] EDC: 09/10/2017 TWIN 2 Yolk sac:  No Embryo:  Yes Cardiac Activity: Yes Heart Rate: 182 bpm CRL:  13.8  mm   7 w 5 d                  [place] EDC: 09/08/2017 Subchorionic hemorrhage:  None visualized. Maternal uterus/adnexae: The uterus is otherwise unremarkable in appearance. Evaluation is suboptimal due to the patient's habitus. The ovaries are not visualized on this study. No suspicious adnexal masses are seen. No free fluid is seen in the pelvic cul-de-sac. IMPRESSION: Live dichorionic diamniotic twin pregnancy, with crown-rump lengths of 1.2 cm and 1.4 cm, reflecting an average gestational age of [redacted] weeks 4 days. This does not match the gestational age by LMP, and reflects a new estimated date of delivery of September 09, 2017. Electronically Signed   By: Roanna Raider M.D.   On: 01/25/2017 23:36   US Ob Comp Addl Gest Less 14 Wks  Result Date: 01/25/2017 CLINICAL DATA:  Acute onset of blood in the vulvar region. Initial encounter. EXAM: TWIN OBSTETRIC <14WK [place] AND TRANSVAGINAL OB US COMPARISON:  None. FINDINGS: Number of IUPs:  2 Chorionicity/Amnionicity:  Dichorionic diamniotic TWIN 1 Yolk sac:  No Embryo:  Yes Cardiac Activity: Yes Heart Rate: 180 bpm CRL:  12.3  mm   7 w 3 d                  [place] EDC: 09/10/2017 TWIN 2 Yolk sac:  No Embryo:  Yes Cardiac Activity: Yes Heart Rate: 182 bpm CRL:  13.8  mm  7 w 5 d                  [place] EDC: 09/08/2017 Subchorionic hemorrhage:  None visualized. Maternal uterus/adnexae: The uterus is otherwise unremarkable in appearance. Evaluation is suboptimal due to the patient's habitus. The ovaries are not visualized on this study. No suspicious adnexal masses are seen. No free fluid is seen in the pelvic cul-de-sac. IMPRESSION: Live dichorionic diamniotic twin pregnancy, with crown-rump lengths of 1.2 cm and 1.4 cm, reflecting an average gestational age of [redacted] weeks 4 days. This does not match the gestational age by LMP, and reflects a new estimated date  of delivery of September 09, 2017. Electronically Signed   By: Roanna Raider M.D.   On: 01/25/2017 23:36   US Ob Transvaginal  Result Date: 01/25/2017 CLINICAL DATA:  Acute onset of blood in the vulvar region. Initial encounter. EXAM: TWIN OBSTETRIC <14WK [place] AND TRANSVAGINAL OB US COMPARISON:  None. FINDINGS: Number of IUPs:  2 Chorionicity/Amnionicity:  Dichorionic diamniotic TWIN 1 Yolk sac:  No Embryo:  Yes Cardiac Activity: Yes Heart Rate: 180 bpm CRL:  12.3  mm   7 w 3 d                  [place] EDC: 09/10/2017 TWIN 2 Yolk sac:  No Embryo:  Yes Cardiac Activity: Yes Heart Rate: 182 bpm CRL:  13.8  mm   7 w 5 d                  [place] EDC: 09/08/2017 Subchorionic hemorrhage:  None visualized. Maternal uterus/adnexae: The uterus is otherwise unremarkable in appearance. Evaluation is suboptimal due to the patient's habitus. The ovaries are not visualized on this study. No suspicious adnexal masses are seen. No free fluid is seen in the pelvic cul-de-sac. IMPRESSION: Live dichorionic diamniotic twin pregnancy, with crown-rump lengths of 1.2 cm and 1.4 cm, reflecting an average gestational age of [redacted] weeks 4 days. This does not match the gestational age by LMP, and reflects a new estimated date of delivery of September 09, 2017. Electronically Signed   By: Roanna Raider M.D.   On: 01/25/2017 23:36    Scheduled Meds: . azithromycin  500 mg Intravenous Q24H  . cefTRIAXone (ROCEPHIN)  IV  1 g Intravenous Q24H  . dextromethorphan-guaiFENesin  1 tablet Oral BID  . enoxaparin (LOVENOX) injection  40 mg Subcutaneous Daily  . ipratropium  0.5 mg Nebulization TID  . levalbuterol  1.25 mg Nebulization TID  . methylPREDNISolone (SOLU-MEDROL) injection  40 mg Intravenous Daily  . multivitamin-iron-minerals-folic acid  2 tablet Oral Daily   Continuous Infusions:   LOS: 4 days    Time spent: 45 minutes    Debe Coder, MD [facility] Pager [phone]  If 7PM-7AM, please contact  night-coverage [email or web address] Password [email or web address] 01/27/2017, 11:20 AM

## 2017-01-28 LAB — CBC
HCT: 38.4 % (ref 36.0–46.0)
HEMOGLOBIN: 12.7 g/dL (ref 12.0–15.0)
MCH: 26.5 pg (ref 26.0–34.0)
MCHC: 33.1 g/dL (ref 30.0–36.0)
MCV: 80.2 fL (ref 78.0–100.0)
Platelets: 318 10*3/uL (ref 150–400)
RBC: 4.79 MIL/uL (ref 3.87–5.11)
RDW: 15.1 % (ref 11.5–15.5)
WBC: 17.1 10*3/uL — ABNORMAL HIGH (ref 4.0–10.5)

## 2017-01-28 LAB — BASIC METABOLIC PANEL
ANION GAP: 7 (ref 5–15)
BUN: 10 mg/dL (ref 6–20)
CALCIUM: 9.2 mg/dL (ref 8.9–10.3)
CO2: 25 mmol/L (ref 22–32)
CREATININE: 0.57 mg/dL (ref 0.44–1.00)
Chloride: 102 mmol/L (ref 101–111)
GFR calc Af Amer: >60 mL/min (ref >60)
GFR calc non Af Amer: >60 mL/min (ref >60)
GLUCOSE: 106 mg/dL — AB (ref 65–99)
Potassium: 4.1 mmol/L (ref 3.5–5.1)
Sodium: 134 mmol/L — ABNORMAL LOW (ref 135–145)

## 2017-01-28 LAB — FERRITIN: Ferritin: 10 ng/mL — ABNORMAL LOW (ref 11–307)

## 2017-01-28 LAB — IRON AND TIBC
IRON: 27 ug/dL — AB (ref 28–170)
Saturation Ratios: 5 % — ABNORMAL LOW (ref 10.4–31.8)
TIBC: 501 ug/dL — AB (ref 250–450)
UIBC: 474 ug/dL

## 2017-01-28 MED ORDER — SODIUM CHLORIDE 0.9 % IV SOLN
INTRAVENOUS | Status: DC
  Administered 2017-01-28 – 2017-01-29 (×2): via INTRAVENOUS

## 2017-01-28 MED ORDER — LEVALBUTEROL HCL 1.25 MG/0.5ML IN NEBU
1.2500 mg | INHALATION_SOLUTION | Freq: Two times a day (BID) | RESPIRATORY_TRACT | Status: DC
  Filled 2017-01-28: qty 0.5

## 2017-01-28 MED ORDER — PHENOL 1.4 % MT LIQD
1.0000 | OROMUCOSAL | Status: DC | PRN
  Filled 2017-01-28: qty 177

## 2017-01-28 MED ORDER — DOCUSATE SODIUM 100 MG PO CAPS
100.0000 mg | ORAL_CAPSULE | Freq: Two times a day (BID) | ORAL | Status: DC
Start: 2017-01-28 — End: 2017-01-29
  Administered 2017-01-28: 100 mg via ORAL
  Filled 2017-01-28: qty 1

## 2017-01-28 MED ORDER — AZITHROMYCIN 250 MG PO TABS: 500.0000 mg | ORAL_TABLET | Freq: Every day | ORAL | Status: DC

## 2017-01-28 MED ORDER — LEVALBUTEROL HCL 1.25 MG/0.5ML IN NEBU
1.2500 mg | INHALATION_SOLUTION | RESPIRATORY_TRACT | Status: DC | PRN
  Filled 2017-01-28: qty 0.5

## 2017-01-28 NOTE — Progress Notes (Signed)
PHARMACIST - PHYSICIAN COMMUNICATION DR:   Sharon SellerMcClung CONCERNING: Antibiotic IV to Oral Route Change Policy  RECOMMENDATION: This patient is receiving Azithromycin by the intravenous route.  Based on criteria approved by the Pharmacy and Therapeutics Committee, the antibiotic(s) is/are being converted to the equivalent oral dose form(s).   DESCRIPTION: These criteria include:  Patient being treated for a respiratory tract infection, urinary tract infection, cellulitis or clostridium difficile associated diarrhea if on metronidazole  The patient is not neutropenic and does not exhibit a GI malabsorption state  The patient is eating (either orally or via tube) and/or has been taking other orally administered medications for a least 24 hours  The patient is improving clinically and has a Tmax < 100.5  If you have questions about this conversion, please contact the Pharmacy Department  []   [phone]( [phone] )  [facility] []   [phone]( [phone]  [facility] []   [phone]( [phone] )  [facility] []   [phone]( [phone] )  [facility] [x]   [phone]( [phone] )  [facility]   Loralee PacasErin Travaris Kosh, PharmD, BCPS Pager: [phone] 01/28/2017 12:16 PM

## 2017-01-28 NOTE — Progress Notes (Signed)
[facility] TEAM 1 - Stepdown/ICU TEAM  Denise Harmon  NFA:[id number] DOB: March 30, 1990 DOA: 01/23/2017 PCP: No primary care provider on file.    Brief Narrative:  26yo woman with Hx of 11 week pregnancy with twins, gestational DM, depression and anxiety who presented for wheezing, cough and SOB for 8 days.  CT angio of the chest was negative for PE on 01/14/17 but showed possible organizing pneumonia/bronchitis.  She was given Rx for azithromycin but did not pick it up.  Her symptoms worsened and she was admitted on 1/24 for pneumonia.  Her hospital stay has been complicated by burning on urination w/ streaky blood loss.  She had a TVUS which showed Di/Di twins which were small for gestational age.    Subjective: The patient tells me she is severely constipated and is suffering with the inability to tolerate oral intake as a result.  She tells me she vomited up water this afternoon around lunchtime.  She denies chest pain or pressure.  She denies severe abdominal pain.  She admits to very poor appetite.  She states her shortness of breath is much improved but that she does have a persisting cough with a very sore dry throat.  Assessment & Plan:  Acute respiratory failure with hypoxia due to RUL CAP  - Urine antigens for legionella and pneumococcus negative - avoid mucinex in early pregnancy  - has completed a 5 day course of abx tx - resp sx much improved - stop abx today and follow clinically   Nausea/vomiting w/ constipation  - do not feel it is safe to d/c this pregnant pt until she is able to reliably tolerate regular oral intake  -change to clear liquids only - stimulate bowels - follow   Pregnancy with Di/Di twins - Avoid any medications contraindicated in pregnancy.  - She has had a history of gestational DM, I advised her that when she is discharged she should seek immediate OB care - she just relocated from [place] and has not yet obtained a local OB per her report  - Prenatal  vitamins - denies bloody D/C today or abdom cramping   Mild normocytic anemia - Hgb has actually improved - follow intermittently    DVT prophylaxis: lovenox  Code Status: FULL CODE Family Communication: no family present at time of exam  Disposition Plan: d/c home when able to tolerate oral intake consistently   Consultants:  none  Procedures: none  Antimicrobials:  Azithromycin 1/24 > 1/28 Ceftriaxone 1/24 > 1/28  Objective: Blood pressure (!) 132/99, pulse 87, temperature 98.4 F (36.9 C), temperature source Oral, resp. rate 16, height 5\' 5"  (1.651 m), weight (!) 149.9 kg (330 lb 7.5 oz), last menstrual period 11/15/2016, SpO2 93 %.  Intake/Output Summary (Last 24 hours) at 01/28/17 1536 Last data filed at 01/28/17 0600  Gross per 24 hour  Intake             3210 ml  Output                0 ml  Net             3210 ml   Filed Weights   01/24/17 0629  Weight: (!) 149.9 kg (330 lb 7.5 oz)    Examination: General: No acute respiratory distress Lungs: Clear to auscultation bilaterally without wheezes or crackles Cardiovascular: Regular rate and rhythm without murmur gallop or rub normal S1 and S2 Abdomen: Nontender, nondistended, soft, bowel sounds positive, no rebound, no ascites,  no appreciable mass Extremities: No significant cyanosis, clubbing, or edema bilateral lower extremities  CBC:  Recent Labs Lab 01/23/17 1958 01/25/17 0603 01/26/17 0553 01/27/17 0556 01/28/17 0512  WBC 22.0* 23.4* 21.0* 17.4* 17.1*  HGB 12.2 11.6* 11.2* 12.8 12.7  HCT 36.8 34.1* 34.1* 38.7 38.4  MCV 80.7 80.8 79.7 80.6 80.2  PLT 354 299 315 313 318   Basic Metabolic Panel:  Recent Labs Lab 01/23/17 1958 01/25/17 0603 01/27/17 0556 01/28/17 0512  NA 134* 135 137 134*  K 3.4* 4.3 3.7 4.1  CL 105 108 104 102  CO2 22 20* 25 25  GLUCOSE 125* 140* 99 106*  BUN 9 8 10 10   CREATININE 0.54 0.45 0.34* 0.57  CALCIUM 8.6* 9.2 9.5 9.2   GFR: Estimated Creatinine Clearance:  161.3 mL/min (by C-G formula based on SCr of 0.57 mg/dL).  Liver Function Tests: No results for input(s): AST, ALT, ALKPHOS, BILITOT, PROT, ALBUMIN in the last 168 hours. No results for input(s): LIPASE, AMYLASE in the last 168 hours. No results for input(s): AMMONIA in the last 168 hours.  Coagulation Profile:  Recent Labs Lab 01/23/17 2355  INR 1.03   CBG:  Recent Labs Lab 01/24/17 0737  GLUCAP 130*    Recent Results (from the past 240 hour(s))  Culture, blood (x 2)     Status: None (Preliminary result)   Collection Time: 01/23/17 11:54 PM  Result Value Ref Range Status   Specimen Description BLOOD LEFT HAND  Final   Special Requests BOTTLES DRAWN AEROBIC AND ANAEROBIC  Final   Culture   Final    NO GROWTH 3 DAYS Performed at [facility], [address] [place], [place] [address]    Report Status PENDING  Incomplete  Culture, blood (x 2)     Status: None (Preliminary result)   Collection Time: 01/23/17 11:55 PM  Result Value Ref Range Status   Specimen Description BLOOD RIGHT WRIST  Final   Special Requests BOTTLES DRAWN AEROBIC AND ANAEROBIC  Final   Culture   Final    NO GROWTH 3 DAYS Performed at [facility], [address], [place], [place] [address]    Report Status PENDING  Incomplete  MRSA PCR Screening     Status: None   Collection Time: 01/24/17  6:22 AM  Result Value Ref Range Status   MRSA by PCR NEGATIVE NEGATIVE Final    Comment:        The GeneXpert MRSA Assay (FDA approved for NASAL specimens only), is one component of a comprehensive MRSA colonization surveillance program. It is not intended to diagnose MRSA infection nor to guide or monitor treatment for MRSA infections.   Culture, sputum-assessment     Status: None   Collection Time: 01/24/17  9:07 AM  Result Value Ref Range Status   Specimen Description SPUTUM  Final   Special Requests NONE  Final   Sputum evaluation THIS SPECIMEN IS ACCEPTABLE FOR SPUTUM  CULTURE  Final   Report Status 01/24/2017 FINAL  Final  Culture, respiratory (NON-Expectorated)     Status: None   Collection Time: 01/24/17  9:07 AM  Result Value Ref Range Status   Specimen Description SPUTUM  Final   Special Requests NONE Reflexed from [id number]  Final   Gram Stain   Final    ABUNDANT WBC PRESENT,BOTH PMN AND MONONUCLEAR ABUNDANT GRAM POSITIVE COCCI MODERATE GRAM POSITIVE RODS FEW GRAM NEGATIVE RODS RARE SQUAMOUS EPITHELIAL CELLS PRESENT  Culture   Final    Consistent with normal respiratory flora. Performed at [facility], [address], [place], [place] [address]    Report Status 01/27/2017 FINAL  Final  Culture, Urine     Status: None   Collection Time: 01/25/17  6:15 PM  Result Value Ref Range Status   Specimen Description URINE, CLEAN CATCH  Final   Special Requests NONE  Final   Culture   Final    NO GROWTH Performed at [facility], [address], [place], [place] [address]    Report Status 01/27/2017 FINAL  Final     Scheduled Meds: . azithromycin  500 mg Oral QHS  . cefTRIAXone (ROCEPHIN)  IV  1 g Intravenous Q24H  . enoxaparin (LOVENOX) injection  40 mg Subcutaneous Daily  . levalbuterol  1.25 mg Nebulization BID  . methylPREDNISolone (SOLU-MEDROL) injection  20 mg Intravenous Daily  . multivitamin-iron-minerals-folic acid  2 tablet Oral Daily     LOS: 5 days   Lonia Blood, MD [facility] Office  [phone] Pager - Text Page per Amion as per below:  On-Call/Text Page:      [email or web address]      password [email or web address]  If 7PM-7AM, please contact night-coverage [email or web address] Password [facility] 01/28/2017, 3:36 PM

## 2017-01-29 LAB — CULTURE, BLOOD (ROUTINE X 2)
Culture: NO GROWTH
Culture: NO GROWTH

## 2017-01-29 MED ORDER — CENTRUM PO CHEW: 2.0000 | CHEWABLE_TABLET | Freq: Every day | ORAL | 0 refills | Status: DC

## 2017-01-29 NOTE — Discharge Summary (Signed)
Physician Discharge Summary  Denise Harmon ZOX:[id number] DOB: 04/10/1990 DOA: 01/23/2017  PCP: No primary care provider on file.  Admit date: 01/23/2017 Discharge date: 01/29/2017  Admitted From: Home  Disposition:  Home   Recommendations for Outpatient Follow-up:  1. Follow up with PCP in 1-2 weeks 2. Please obtain BMP/CBC in one week 3. Needs to follow up with OB for further care of pregnancy    Home Health: none  Discharge Condition: Stable.  CODE STATUS: full code.  Diet recommendation: Heart Healthy  Brief/Interim Summary: 26yo woman with Hx of 11 week pregnancy with twins, gestational DM, depression and anxiety who presented for wheezing, cough and SOB for 8 days. CT angio of the chest was negative for PE on 01/14/17 but showed possible organizing pneumonia/bronchitis. She was given Rx for azithromycin but did not pick it up. Her symptoms worsened and she was admitted on 1/24 for pneumonia. Her hospital stay has been complicated by burning on urination w/ streaky blood loss. She had a TVUS which showed Di/Di twins which were small for gestational age.   Subjective: The patient tells me she is severely constipated and is suffering with the inability to tolerate oral intake as a result.  She tells me she vomited up water this afternoon around lunchtime.  She denies chest pain or pressure.  She denies severe abdominal pain.  She admits to very poor appetite.  She states her shortness of breath is much improved but that she does have a persisting cough with a very sore dry throat.  Assessment & Plan:  Acute respiratory failure with hypoxia due to RUL CAP  - Urine antigens for legionella and pneumococcus negative - avoid mucinex in early pregnancy  - has completed a 5 day course of abx tx - resp sx much improved - stop abx today and follow clinically   -stable for discharge, no wheezing on lung exam, denies dyspnea.   Nausea/vomiting w/ constipation  -resolved.  Feeling better. Tolerating diet.   Pregnancy with Di/Di twins - Avoid any medications contraindicated in pregnancy.  - She has had a history of gestational DM, I advised her that when she is discharged she should seek immediate OB care - she just relocated from [place] and has not yet obtained a local OB per her report  - Prenatal vitamins - denies bloody D/C today or abdom cramping  -discharge on multivitamins.  -advised patient not to take ibuprofen.  -explain to her risk of albuterol for fetus.   Mild normocytic anemia - Hgb has actually improved - follow intermittently  -discharge on multivitamin with iron.   Discharge Diagnoses:  Principal Problem:   Acute respiratory failure with hypoxia (HCC) Active Problems:   CAP (community acquired pneumonia)   Bronchitis   Pregnancy   Hypokalemia   Sepsis (HCC)   Bleeding    Discharge Instructions  Discharge Instructions    Diet - low sodium heart healthy    Complete by:  As directed    Increase activity slowly    Complete by:  As directed      Allergies as of 01/29/2017      Reactions   Naproxen Hives      Medication List    STOP taking these medications   azithromycin 250 MG tablet Commonly known as:  ZITHROMAX   guaiFENesin-dextromethorphan 100-10 MG/5ML syrup Commonly known as:  ROBITUSSIN DM   methylPREDNISolone 8 MG tablet Commonly known as:  MEDROL     TAKE these medications   albuterol  108 (90 Base) MCG/ACT inhaler Commonly known as:  PROVENTIL HFA;VENTOLIN HFA Inhale 1-2 puffs into the lungs every 6 (six) hours as needed for wheezing or shortness of breath.   multivitamin-iron-minerals-folic acid chewable tablet Chew 2 tablets by mouth daily.       Allergies  Allergen Reactions  . Naproxen Hives    Consultations:  none   Procedures/Studies: Dg Chest 2 View  Result Date: 01/23/2017 CLINICAL DATA:  Initial evaluation for acute shortness of breath. EXAM: CHEST  2 VIEW COMPARISON:  Prior CT  from 01/14/2017. FINDINGS: Cardiac and mediastinal silhouettes are stable in size and contour, and remain within normal limits. Lungs normally inflated. Streaky opacities with medial right upper lobe seen, similar to previous, which may reflect atelectasis and/ or scarring. Superimposed persistent pneumonia not excluded. No other focal airspace disease. No pulmonary edema or pleural effusion. No pneumothorax. No acute osseous abnormality. IMPRESSION: Persistent streaky opacity within the medial right upper lobe, which may reflect atelectasis and/ or scarring. Superimposed bronchopneumonia is not excluded, and could be considered in the correct clinical setting. Electronically Signed   By: Rise Mu M.D.   On: 01/23/2017 21:56   Ct Angio Chest Pe W And/or Wo Contrast  Result Date: 01/14/2017 CLINICAL DATA:  28 y/o F; 2 months pregnant. Three days of shortness of breath. EXAM: CT ANGIOGRAPHY CHEST WITH CONTRAST TECHNIQUE: Multidetector CT imaging of the chest was performed using the standard protocol during bolus administration of intravenous contrast. Multiplanar CT image reconstructions and MIPs were obtained to evaluate the vascular anatomy. CONTRAST:  100 cc Isovue 370. COMPARISON:  None. FINDINGS: Cardiovascular: Satisfactory opacification of the pulmonary arteries to the segmental level. No evidence of pulmonary embolism. Normal heart size. No pericardial effusion. Mediastinum/Nodes: No enlarged mediastinal, hilar, or axillary lymph nodes. Thyroid gland, trachea, and esophagus demonstrate no significant findings. Lungs/Pleura: There is diffuse peribronchial thickening and patchy ground-glass opacities in the right upper lobe apical segment in a bronchovascular distribution with few additional minor ground-glass opacities in the lung bases. No pleural effusion or pneumothorax. Upper Abdomen: No acute abnormality. Musculoskeletal: No chest wall abnormality. No acute or significant osseous findings.  Review of the MIP images confirms the above findings. IMPRESSION: 1. No pulmonary embolus identified. 2. Diffuse peribronchial thickening and patchy ground-glass opacities in the right upper lobe apical segment probably represents acute bronchitis with associated atelectasis or developing bronchopneumonia. Electronically Signed   By: Mitzi Hansen M.D.   On: 01/14/2017 23:44   US Ob Comp Less 14 Wks  Result Date: 01/25/2017 CLINICAL DATA:  Acute onset of blood in the vulvar region. Initial encounter. EXAM: TWIN OBSTETRIC <14WK [place] AND TRANSVAGINAL OB US COMPARISON:  None. FINDINGS: Number of IUPs:  2 Chorionicity/Amnionicity:  Dichorionic diamniotic TWIN 1 Yolk sac:  No Embryo:  Yes Cardiac Activity: Yes Heart Rate: 180 bpm CRL:  12.3  mm   7 w 3 d                  [place] EDC: 09/10/2017 TWIN 2 Yolk sac:  No Embryo:  Yes Cardiac Activity: Yes Heart Rate: 182 bpm CRL:  13.8  mm   7 w 5 d                  [place] EDC: 09/08/2017 Subchorionic hemorrhage:  None visualized. Maternal uterus/adnexae: The uterus is otherwise unremarkable in appearance. Evaluation is suboptimal due to the patient's habitus. The ovaries are not visualized on this study. No suspicious adnexal masses are seen.  No free fluid is seen in the pelvic cul-de-sac. IMPRESSION: Live dichorionic diamniotic twin pregnancy, with crown-rump lengths of 1.2 cm and 1.4 cm, reflecting an average gestational age of [redacted] weeks 4 days. This does not match the gestational age by LMP, and reflects a new estimated date of delivery of September 09, 2017. Electronically Signed   By: Roanna Raider M.D.   On: 01/25/2017 23:36   US Ob Comp Addl Gest Less 14 Wks  Result Date: 01/25/2017 CLINICAL DATA:  Acute onset of blood in the vulvar region. Initial encounter. EXAM: TWIN OBSTETRIC <14WK [place] AND TRANSVAGINAL OB US COMPARISON:  None. FINDINGS: Number of IUPs:  2 Chorionicity/Amnionicity:  Dichorionic diamniotic TWIN 1 Yolk sac:  No Embryo:  Yes Cardiac Activity: Yes  Heart Rate: 180 bpm CRL:  12.3  mm   7 w 3 d                  [place] EDC: 09/10/2017 TWIN 2 Yolk sac:  No Embryo:  Yes Cardiac Activity: Yes Heart Rate: 182 bpm CRL:  13.8  mm   7 w 5 d                  [place] EDC: 09/08/2017 Subchorionic hemorrhage:  None visualized. Maternal uterus/adnexae: The uterus is otherwise unremarkable in appearance. Evaluation is suboptimal due to the patient's habitus. The ovaries are not visualized on this study. No suspicious adnexal masses are seen. No free fluid is seen in the pelvic cul-de-sac. IMPRESSION: Live dichorionic diamniotic twin pregnancy, with crown-rump lengths of 1.2 cm and 1.4 cm, reflecting an average gestational age of [redacted] weeks 4 days. This does not match the gestational age by LMP, and reflects a new estimated date of delivery of September 09, 2017. Electronically Signed   By: Roanna Raider M.D.   On: 01/25/2017 23:36   US Ob Transvaginal  Result Date: 01/25/2017 CLINICAL DATA:  Acute onset of blood in the vulvar region. Initial encounter. EXAM: TWIN OBSTETRIC <14WK [place] AND TRANSVAGINAL OB US COMPARISON:  None. FINDINGS: Number of IUPs:  2 Chorionicity/Amnionicity:  Dichorionic diamniotic TWIN 1 Yolk sac:  No Embryo:  Yes Cardiac Activity: Yes Heart Rate: 180 bpm CRL:  12.3  mm   7 w 3 d                  [place] EDC: 09/10/2017 TWIN 2 Yolk sac:  No Embryo:  Yes Cardiac Activity: Yes Heart Rate: 182 bpm CRL:  13.8  mm   7 w 5 d                  [place] EDC: 09/08/2017 Subchorionic hemorrhage:  None visualized. Maternal uterus/adnexae: The uterus is otherwise unremarkable in appearance. Evaluation is suboptimal due to the patient's habitus. The ovaries are not visualized on this study. No suspicious adnexal masses are seen. No free fluid is seen in the pelvic cul-de-sac. IMPRESSION: Live dichorionic diamniotic twin pregnancy, with crown-rump lengths of 1.2 cm and 1.4 cm, reflecting an average gestational age of [redacted] weeks 4 days. This does not match the gestational age by LMP, and  reflects a new estimated date of delivery of September 09, 2017. Electronically Signed   By: Roanna Raider M.D.   On: 01/25/2017 23:36    (Echo, Carotid, EGD, Colonoscopy, ERCP)    Subjective:   Discharge Exam: Vitals:   01/28/17 2036 01/29/17 0535  BP: 131/65 (!) 126/58  Pulse: 97 85  Resp: 18 16  Temp:  98.3 F (36.8 C) 98.4 F (36.9 C)   Vitals:   01/28/17 0912 01/28/17 1541 01/28/17 2036 01/29/17 0535  BP:  (!) 126/52 131/65 (!) 126/58  Pulse:  88 97 85  Resp:  18 18 16   Temp:  98.7 F (37.1 C) 98.3 F (36.8 C) 98.4 F (36.9 C)  TempSrc:  Oral Oral Oral  SpO2: 93% 94% 94% 95%  Weight:      Height:        General: Pt is alert, awake, not in acute distress Cardiovascular: RRR, S1/S2 +, no rubs, no gallops Respiratory: CTA bilaterally, no wheezing, no rhonchi Abdominal: Soft, NT, ND, bowel sounds + Extremities: no edema, no cyanosis    The results of significant diagnostics from this hospitalization (including imaging, microbiology, ancillary and laboratory) are listed below for reference.     Microbiology: Recent Results (from the past 240 hour(s))  Culture, blood (x 2)     Status: None (Preliminary result)   Collection Time: 01/23/17 11:54 PM  Result Value Ref Range Status   Specimen Description BLOOD LEFT HAND  Final   Special Requests BOTTLES DRAWN AEROBIC AND ANAEROBIC  Final   Culture   Final    NO GROWTH 4 DAYS Performed at [facility], [address], [place], [place] [address]    Report Status PENDING  Incomplete  Culture, blood (x 2)     Status: None (Preliminary result)   Collection Time: 01/23/17 11:55 PM  Result Value Ref Range Status   Specimen Description BLOOD RIGHT WRIST  Final   Special Requests BOTTLES DRAWN AEROBIC AND ANAEROBIC  Final   Culture   Final    NO GROWTH 4 DAYS Performed at [facility], [address], [place], [place] [address]    Report Status PENDING  Incomplete  MRSA PCR Screening     Status:  None   Collection Time: 01/24/17  6:22 AM  Result Value Ref Range Status   MRSA by PCR NEGATIVE NEGATIVE Final    Comment:        The GeneXpert MRSA Assay (FDA approved for NASAL specimens only), is one component of a comprehensive MRSA colonization surveillance program. It is not intended to diagnose MRSA infection nor to guide or monitor treatment for MRSA infections.   Culture, sputum-assessment     Status: None   Collection Time: 01/24/17  9:07 AM  Result Value Ref Range Status   Specimen Description SPUTUM  Final   Special Requests NONE  Final   Sputum evaluation THIS SPECIMEN IS ACCEPTABLE FOR SPUTUM CULTURE  Final   Report Status 01/24/2017 FINAL  Final  Culture, respiratory (NON-Expectorated)     Status: None   Collection Time: 01/24/17  9:07 AM  Result Value Ref Range Status   Specimen Description SPUTUM  Final   Special Requests NONE Reflexed from [id number]  Final   Gram Stain   Final    ABUNDANT WBC PRESENT,BOTH PMN AND MONONUCLEAR ABUNDANT GRAM POSITIVE COCCI MODERATE GRAM POSITIVE RODS FEW GRAM NEGATIVE RODS RARE SQUAMOUS EPITHELIAL CELLS PRESENT    Culture   Final    Consistent with normal respiratory flora. Performed at [facility], [address] [place], [place] [address]    Report Status 01/27/2017 FINAL  Final  Culture, Urine     Status: None   Collection Time: 01/25/17  6:15 PM  Result Value Ref Range Status   Specimen Description URINE, CLEAN CATCH  Final  Special Requests NONE  Final   Culture   Final    NO GROWTH Performed at [facility], [address] [place], [place] [address]    Report Status 01/27/2017 FINAL  Final     Labs: BNP (last 3 results) No results for input(s): BNP in the last 8760 hours. Basic Metabolic Panel:  Recent Labs Lab 01/23/17 1958 01/25/17 0603 01/27/17 0556 01/28/17 0512  NA 134* 135 137 134*  K 3.4* 4.3 3.7 4.1  CL 105 108 104 102  CO2 22 20* 25 25  GLUCOSE 125* 140* 99 106*  BUN 9 8 10  10   CREATININE 0.54 0.45 0.34* 0.57  CALCIUM 8.6* 9.2 9.5 9.2   Liver Function Tests: No results for input(s): AST, ALT, ALKPHOS, BILITOT, PROT, ALBUMIN in the last 168 hours. No results for input(s): LIPASE, AMYLASE in the last 168 hours. No results for input(s): AMMONIA in the last 168 hours. CBC:  Recent Labs Lab 01/23/17 1958 01/25/17 0603 01/26/17 0553 01/27/17 0556 01/28/17 0512  WBC 22.0* 23.4* 21.0* 17.4* 17.1*  HGB 12.2 11.6* 11.2* 12.8 12.7  HCT 36.8 34.1* 34.1* 38.7 38.4  MCV 80.7 80.8 79.7 80.6 80.2  PLT 354 299 315 313 318   Cardiac Enzymes: No results for input(s): CKTOTAL, CKMB, CKMBINDEX, TROPONINI in the last 168 hours. BNP: Invalid input(s): POCBNP CBG:  Recent Labs Lab 01/24/17 0737  GLUCAP 130*   D-Dimer No results for input(s): DDIMER in the last 72 hours. Hgb A1c No results for input(s): HGBA1C in the last 72 hours. Lipid Profile No results for input(s): CHOL, HDL, LDLCALC, TRIG, CHOLHDL, LDLDIRECT in the last 72 hours. Thyroid function studies No results for input(s): TSH, T4TOTAL, T3FREE, THYROIDAB in the last 72 hours.  Invalid input(s): FREET3 Anemia work up  Recent Labs  01/28/17 0512  FERRITIN 10*  TIBC 501*  IRON 27*   Urinalysis    Component Value Date/Time   COLORURINE YELLOW 01/25/2017 1815   APPEARANCEUR CLEAR 01/25/2017 1815   LABSPEC 1.013 01/25/2017 1815   PHURINE 7.0 01/25/2017 1815   GLUCOSEU 50 (A) 01/25/2017 1815   HGBUR SMALL (A) 01/25/2017 1815   BILIRUBINUR NEGATIVE 01/25/2017 1815   KETONESUR NEGATIVE 01/25/2017 1815   PROTEINUR NEGATIVE 01/25/2017 1815   NITRITE NEGATIVE 01/25/2017 1815   LEUKOCYTESUR NEGATIVE 01/25/2017 1815   Sepsis Labs Invalid input(s): PROCALCITONIN,  WBC,  LACTICIDVEN Microbiology Recent Results (from the past 240 hour(s))  Culture, blood (x 2)     Status: None (Preliminary result)   Collection Time: 01/23/17 11:54 PM  Result Value Ref Range Status   Specimen Description  BLOOD LEFT HAND  Final   Special Requests BOTTLES DRAWN AEROBIC AND ANAEROBIC  Final   Culture   Final    NO GROWTH 4 DAYS Performed at [facility], [address], [place], [place] [address]    Report Status PENDING  Incomplete  Culture, blood (x 2)     Status: None (Preliminary result)   Collection Time: 01/23/17 11:55 PM  Result Value Ref Range Status   Specimen Description BLOOD RIGHT WRIST  Final   Special Requests BOTTLES DRAWN AEROBIC AND ANAEROBIC  Final   Culture   Final    NO GROWTH 4 DAYS Performed at [facility], [address], [place], [place] [address]    Report Status PENDING  Incomplete  MRSA PCR Screening     Status: None   Collection Time: 01/24/17  6:22 AM  Result Value Ref Range Status   MRSA by PCR NEGATIVE NEGATIVE Final    Comment:        The GeneXpert MRSA Assay (FDA approved for NASAL specimens only), is one component of a comprehensive MRSA colonization surveillance program. It is not intended to diagnose MRSA infection nor to guide or monitor treatment for MRSA infections.   Culture, sputum-assessment     Status: None   Collection Time: 01/24/17  9:07 AM  Result Value Ref Range Status   Specimen Description SPUTUM  Final   Special Requests NONE  Final   Sputum evaluation THIS SPECIMEN IS ACCEPTABLE FOR SPUTUM CULTURE  Final   Report Status 01/24/2017 FINAL  Final  Culture, respiratory (NON-Expectorated)     Status: None   Collection Time: 01/24/17  9:07 AM  Result Value Ref Range Status   Specimen Description SPUTUM  Final   Special Requests NONE Reflexed from [id number]  Final   Gram Stain   Final    ABUNDANT WBC PRESENT,BOTH PMN AND MONONUCLEAR ABUNDANT GRAM POSITIVE COCCI MODERATE GRAM POSITIVE RODS FEW GRAM NEGATIVE RODS RARE SQUAMOUS EPITHELIAL CELLS PRESENT    Culture   Final    Consistent with normal respiratory flora. Performed at [facility], [address] [place], [place] [address]    Report Status  01/27/2017 FINAL  Final  Culture, Urine     Status: None   Collection Time: 01/25/17  6:15 PM  Result Value Ref Range Status   Specimen Description URINE, CLEAN CATCH  Final   Special Requests NONE  Final   Culture   Final    NO GROWTH Performed at [facility], [address] [place], [place] [address]    Report Status 01/27/2017 FINAL  Final     Time coordinating discharge: Over 30 minutes  SIGNED:   Alba Coryegalado, Wren Gallaga A, MD  [facility] 01/29/2017, 8:51 AM Pager   If 7PM-7AM, please contact night-coverage [email or web address] Password [email or web address]

## 2017-01-29 NOTE — Care Management Note (Signed)
Case Management Note  Patient Details  Name: Denise Harmon MRN: [id number] Date of Birth: 1990-08-20  Subjective/Objective: d/c home. No CM needs or orders.                   Action/Plan:d/c home.   Expected Discharge Date:  01/29/17               Expected Discharge Plan:  Home/Self Care  In-House Referral:     Discharge planning Services  CM Consult  Post Acute Care Choice:    Choice offered to:     DME Arranged:    DME Agency:     HH Arranged:    HH Agency:     Status of Service:  Completed, signed off  If discussed at [employer] Length of Stay Meetings, dates discussed:    Additional Comments:  Lanier ClamMahabir, Denise Bresnan, RN 01/29/2017, 9:53 AM

## 2017-03-02 ENCOUNTER — Emergency Department (HOSPITAL_COMMUNITY)
Admission: EM | Admit: 2017-03-02 | Discharge: 2017-03-02 | Disposition: A | Discharge status: Home / Self Care | Payer: Medicaid Other | Attending: Emergency Medicine | Admitting: Emergency Medicine

## 2017-03-02 ENCOUNTER — Emergency Department (HOSPITAL_COMMUNITY): Payer: Medicaid Other

## 2017-03-02 ENCOUNTER — Encounter (HOSPITAL_COMMUNITY): Payer: Self-pay | Admitting: Emergency Medicine

## 2017-03-02 DIAGNOSIS — Z3A15 15 weeks gestation of pregnancy: Secondary | ICD-10-CM | POA: Diagnosis not present

## 2017-03-02 DIAGNOSIS — O208 Other hemorrhage in early pregnancy: Secondary | ICD-10-CM | POA: Diagnosis not present

## 2017-03-02 DIAGNOSIS — J45901 Unspecified asthma with (acute) exacerbation: Secondary | ICD-10-CM

## 2017-03-02 DIAGNOSIS — Z3A12 12 weeks gestation of pregnancy: Secondary | ICD-10-CM

## 2017-03-02 DIAGNOSIS — Z79899 Other long term (current) drug therapy: Secondary | ICD-10-CM | POA: Insufficient documentation

## 2017-03-02 DIAGNOSIS — R0602 Shortness of breath: Secondary | ICD-10-CM

## 2017-03-02 DIAGNOSIS — Z87891 Personal history of nicotine dependence: Secondary | ICD-10-CM | POA: Insufficient documentation

## 2017-03-02 DIAGNOSIS — R062 Wheezing: Secondary | ICD-10-CM

## 2017-03-02 DIAGNOSIS — O99511 Diseases of the respiratory system complicating pregnancy, first trimester: Secondary | ICD-10-CM | POA: Diagnosis present

## 2017-03-02 DIAGNOSIS — E119 Type 2 diabetes mellitus without complications: Secondary | ICD-10-CM | POA: Insufficient documentation

## 2017-03-02 DIAGNOSIS — N939 Abnormal uterine and vaginal bleeding, unspecified: Secondary | ICD-10-CM

## 2017-03-02 LAB — CBC WITH DIFFERENTIAL/PLATELET
BASOS ABS: 0 10*3/uL (ref 0.0–0.1)
BASOS PCT: 0 %
EOS PCT: 7 %
Eosinophils Absolute: 0.9 10*3/uL — ABNORMAL HIGH (ref 0.0–0.7)
HEMATOCRIT: 35.7 % — AB (ref 36.0–46.0)
Hemoglobin: 12.2 g/dL (ref 12.0–15.0)
Lymphocytes Relative: 30 %
Lymphs Abs: 3.9 10*3/uL (ref 0.7–4.0)
MCH: 27.2 pg (ref 26.0–34.0)
MCHC: 34.2 g/dL (ref 30.0–36.0)
MCV: 79.7 fL (ref 78.0–100.0)
MONO ABS: 0.4 10*3/uL (ref 0.1–1.0)
MONOS PCT: 3 %
Neutro Abs: 7.9 10*3/uL — ABNORMAL HIGH (ref 1.7–7.7)
Neutrophils Relative %: 60 %
PLATELETS: 335 10*3/uL (ref 150–400)
RBC: 4.48 MIL/uL (ref 3.87–5.11)
RDW: 15 % (ref 11.5–15.5)
WBC: 13.3 10*3/uL — ABNORMAL HIGH (ref 4.0–10.5)

## 2017-03-02 LAB — BASIC METABOLIC PANEL
Anion gap: 7 (ref 5–15)
BUN: 7 mg/dL (ref 6–20)
CHLORIDE: 107 mmol/L (ref 101–111)
CO2: 22 mmol/L (ref 22–32)
CREATININE: 0.49 mg/dL (ref 0.44–1.00)
Calcium: 9.2 mg/dL (ref 8.9–10.3)
GFR calc non Af Amer: >60 mL/min (ref >60)
Glucose, Bld: 99 mg/dL (ref 65–99)
Potassium: 3.4 mmol/L — ABNORMAL LOW (ref 3.5–5.1)
SODIUM: 136 mmol/L (ref 135–145)

## 2017-03-02 LAB — HCG, QUANTITATIVE, PREGNANCY: hCG, Beta Chain, Quant, S: 61847 m[IU]/mL — ABNORMAL HIGH (ref <5)

## 2017-03-02 MED ORDER — METOCLOPRAMIDE HCL 10 MG PO TABS: 10.0000 mg | ORAL_TABLET | Freq: Four times a day (QID) | ORAL | 0 refills | Status: DC

## 2017-03-02 MED ORDER — PREDNISONE 20 MG PO TABS: 60.0000 mg | ORAL_TABLET | Freq: Once | ORAL | Status: DC

## 2017-03-02 MED ORDER — ALBUTEROL SULFATE (2.5 MG/3ML) 0.083% IN NEBU
5.0000 mg | INHALATION_SOLUTION | Freq: Once | RESPIRATORY_TRACT | Status: AC
  Administered 2017-03-02: 5 mg via RESPIRATORY_TRACT
  Filled 2017-03-02: qty 6

## 2017-03-02 MED ORDER — ALBUTEROL SULFATE HFA 108 (90 BASE) MCG/ACT IN AERS
1.0000 | INHALATION_SPRAY | Freq: Once | RESPIRATORY_TRACT | Status: AC
  Administered 2017-03-02: 2 via RESPIRATORY_TRACT
  Filled 2017-03-02: qty 6.7

## 2017-03-02 MED ORDER — ALBUTEROL (5 MG/ML) CONTINUOUS INHALATION SOLN
10.0000 mg/h | INHALATION_SOLUTION | RESPIRATORY_TRACT | Status: AC
  Administered 2017-03-02: 10 mg/h via RESPIRATORY_TRACT
  Filled 2017-03-02: qty 20

## 2017-03-02 MED ORDER — PREDNISONE 10 MG (21) PO TBPK: ORAL_TABLET | Freq: Every day | ORAL | 0 refills | Status: DC

## 2017-03-02 MED ORDER — METHYLPREDNISOLONE SODIUM SUCC 125 MG IJ SOLR
125.0000 mg | Freq: Once | INTRAMUSCULAR | Status: AC
  Administered 2017-03-02: 125 mg via INTRAVENOUS
  Filled 2017-03-02: qty 2

## 2017-03-02 NOTE — ED Notes (Signed)
Bed: WA09 Expected date:  Expected time:  Means of arrival:  Comments: Hold for triage 2 

## 2017-03-02 NOTE — ED Provider Notes (Signed)
Sign out from Sharilyn SitesLisa Sanders, PA-C  Pregnant with twins  Dx with pneumonia earlier this year; still coughing x 5 days, wheezing  Short nebs, IV solumedrol, CAT now (improving)  If better and lungs clear, walk, and feeling well, d/c with prednisone taper and inhaler, follow up with PCP; If hypoxic, admit  On reevaluation, patient is breathing well and lungs are clear. Oxygen saturations 100%. Patient states she had some vaginal bleeding yesterday which is resolved today. Patient reported that she is going to ultrasound today as discussed with past provider. Will order a pelvic ultrasound considering vaginal bleeding and increased nausea and vomiting over the past week according to patient.  Patient ambulated prior to discharge with oxygen saturations 98-100%. Lungs are clear to auscultation and patient breathing at baseline.  Pelvic ultrasound shows 1. Intrauterine fetal demise of 1 fetus in a dichorionic diamniotic twin pregnancy. The residual gestational sac is visualized without embryo or yolk sac. 2. The surviving fetus demonstrates normal fetal heart tones and measures 15 weeks 3 days by biparietal diameter. [facility]. Recommend close clinical and sonographic follow-up. This exam is performed on an emergent basis and does not comprehensively evaluate fetal size, dating, or anatomy; follow-up complete OB US should be considered if further fetal assessment is warranted.  I made patient aware of findings and to follow up and establish care with OB/GYN as soon as possible. Patient also given referral to women's if she is unable to make an appointment with [facility]. Return precautions discussed.  Discharge home with albuterol inhaler, prednisone taper, Reglan for nausea and vomiting. Patient understands and agrees with plan. Patient discharged in satisfactory condition.     Emi Holeslexandra M Rayden Scheper, PA-C 03/02/17 2029    Cathren LaineKevin Steinl, MD 03/03/17 [phone]

## 2017-03-02 NOTE — ED Notes (Signed)
Two unsuccessful IV attempts.

## 2017-03-02 NOTE — ED Triage Notes (Signed)
Per pt, states started having cold symptoms, cough for 5 days-states she is 11-[redacted] weeks pregnant-states history of PNA-states symptoms feels the same

## 2017-03-02 NOTE — ED Notes (Addendum)
Pt having ultrasound done, will ambulate when finished

## 2017-03-02 NOTE — Discharge Instructions (Signed)
Medications: Prednisone, albuterol inhaler, Reglan  Treatment: Take prednisone as prescribed. Make sure to finish all of this medication. Use albuterol inhaler every 4-6 hours as needed for wheezing or shortness of breath. Take Reglan every 6 hours as needed for nausea and vomiting.  Follow-up: Please follow up with your primary care provider for follow up and further evaluation and treatment of your asthma exacerbations. Please follow up and establish care with an OB/GYN, either [facility], or you can also try [facility] as outlined below.

## 2017-03-02 NOTE — ED Notes (Signed)
O2 remained 99-100 when ambulating

## 2017-03-02 NOTE — ED Provider Notes (Signed)
[facility]-EMERGENCY DEPT Provider Note   CSN: [id number] Arrival date & time: 03/02/17  1245     History   Chief Complaint Chief Complaint  Patient presents with  . wheezing/short of breath    HPI Denise Harmon is a 27 y.o. female.  The history is provided by the patient and medical records.   27 year old female with history of anxiety, depression, diabetes, PTSD, presenting to the ED for shortness of breath. Reports this is been ongoing for the past 5 days. States she's been having cough with with thick mucus.  She denies fever, body aches, ear pain, sore throat, or other URI symptoms.  Patient was diagnosed with CAP in January and was admitted for about 1 week here.  States she is concerned for the same.  Hx of asthma years ago, no other baseline respiratory issues.  States she has been using a combivent inhaler she was given earlier this year without relief.  Denies chest pain.  No hx of DVT or PE.  Denies leg pain or swelling.  Patient is approx [redacted] weeks pregnant with twins.  No complications such as cramping, bleeding, vaginal discharge, pelvic pain, loss of fluids, etc.  Past Medical History:  Diagnosis Date  . Anxiety   . Depression   . Diabetes mellitus without complication (HCC)   . PTSD (post-traumatic stress disorder)     Patient Active Problem List   Diagnosis Date Noted  . Bleeding   . Bronchitis 01/24/2017  . Acute respiratory failure with hypoxia (HCC) 01/24/2017  . Pregnancy 01/24/2017  . Hypokalemia 01/24/2017  . Sepsis (HCC) 01/24/2017  . CAP (community acquired pneumonia) 01/23/2017    Past Surgical History:  Procedure Laterality Date  . CESAREAN SECTION    . MULTIPLE TOOTH EXTRACTIONS    . SHOULDER SURGERY      OB History    Gravida Para Term Preterm AB Living   5 3 3   1 3    SAB TAB Ectopic Multiple Live Births   1       3       Home Medications    Prior to Admission medications   Medication Sig Start Date End Date Taking?  Authorizing Provider  albuterol (PROVENTIL HFA;VENTOLIN HFA) 108 (90 Base) MCG/ACT inhaler Inhale 1-2 puffs into the lungs every 6 (six) hours as needed for wheezing or shortness of breath.    Historical Provider, MD  multivitamin-iron-minerals-folic acid (CENTRUM) chewable tablet Chew 2 tablets by mouth daily. 01/29/17   Alba CoryBelkys A Regalado, MD    Family History Family History  Problem Relation Age of Onset  . Diabetes Mellitus II Mother   . Diabetes Mellitus II Father   . Hypertension Father   . Asthma Sister     Social History Social History  Substance Use Topics  . Smoking status: Former Games developermoker  . Smokeless tobacco: Former NeurosurgeonUser  . Alcohol use No     Allergies   Naproxen   Review of Systems Review of Systems  Respiratory: Positive for cough, shortness of breath and wheezing.   All other systems reviewed and are negative.    Physical Exam Updated Vital Signs BP 150/83 (BP Location: Right Arm)   Pulse 96   Temp 97.6 F (36.4 C) (Oral)   Resp 20   LMP 11/15/2016   SpO2 96%   Physical Exam  Constitutional: She is oriented to person, place, and time. She appears well-developed and well-nourished.  obese  HENT:  Head: Normocephalic and atraumatic.  Mouth/Throat:  Oropharynx is clear and moist.  Eyes: Conjunctivae and EOM are normal. Pupils are equal, round, and reactive to light.  Neck: Normal range of motion.  Cardiovascular: Normal rate, regular rhythm and normal heart sounds.   Pulmonary/Chest: Effort normal. She has wheezes.  Diffuse inspiratory and expiratory wheezes throughout, tachypneic with increased work of breathing, able to speak in full sentences without much issue; O2 sats around 92% on RA during exam  Abdominal: Soft. Bowel sounds are normal.  Musculoskeletal: Normal range of motion.  Neurological: She is alert and oriented to person, place, and time.  Skin: Skin is warm and dry.  Psychiatric: She has a normal mood and affect.  Nursing note and  vitals reviewed.    ED Treatments / Results  Labs (all labs ordered are listed, but only abnormal results are displayed) Labs Reviewed  CBC WITH DIFFERENTIAL/PLATELET - Abnormal; Notable for the following:       Result Value   WBC 13.3 (*)    HCT 35.7 (*)    Neutro Abs 7.9 (*)    Eosinophils Absolute 0.9 (*)    All other components within normal limits  BASIC METABOLIC PANEL - Abnormal; Notable for the following:    Potassium 3.4 (*)    All other components within normal limits  HCG, QUANTITATIVE, PREGNANCY    EKG  EKG Interpretation None       Radiology Dg Chest 2 View  Result Date: 03/02/2017 CLINICAL DATA:  Productive cough with bright red blood. Shortness of breath. EXAM: CHEST  2 VIEW COMPARISON:  01/23/2017 FINDINGS: Both lungs are clear. Heart and mediastinum are within normal limits. Negative for a pneumothorax. No pleural effusion. No acute bone abnormality. Trachea is midline. IMPRESSION: No active cardiopulmonary disease. Electronically Signed   By: Richarda Overlie M.D.   On: 03/02/2017 14:13    Procedures Procedures (including critical care time)  CRITICAL CARE Performed by: Garlon Hatchet   Total critical care time: 40 minutes  Critical care time was exclusive of separately billable procedures and treating other patients.  Critical care was necessary to treat or prevent imminent or life-threatening deterioration.  Critical care was time spent personally by me on the following activities: development of treatment plan with patient and/or surrogate as well as nursing, discussions with consultants, evaluation of patient's response to treatment, examination of patient, obtaining history from patient or surrogate, ordering and performing treatments and interventions, ordering and review of laboratory studies, ordering and review of radiographic studies, pulse oximetry and re-evaluation of patient's condition.   Medications Ordered in ED Medications  albuterol  (PROVENTIL,VENTOLIN) solution continuous neb (10 mg/hr Nebulization New Bag/Given 03/02/17 1453)  albuterol (PROVENTIL) (2.5 MG/3ML) 0.083% nebulizer solution 5 mg (5 mg Nebulization Given 03/02/17 1342)  methylPREDNISolone sodium succinate (SOLU-MEDROL) 125 mg/2 mL injection 125 mg (125 mg Intravenous Given 03/02/17 1502)     Initial Impression / Assessment and Plan / ED Course  I have reviewed the triage vital signs and the nursing notes.  Pertinent labs & imaging results that were available during my care of the patient were reviewed by me and considered in my medical decision making (see chart for details).  27 year old female proximally [redacted] weeks gestation with twin presenting to the ED with shortness of breath. Reports recent admission earlier this year for pneumonia. Reports she began having shortness of breath and wheezing about 5 days ago. She is afebrile and nontoxic. She does have diffuse inspiratory and expiratory wheezes throughout. She has tachypnea with increased work  of breathing. Is able to speak in sentences on exam but O2 sats are borderline at 92%. We'll plan for chest x-ray and nebs.  2:26 PM After initial neb, patient has not had any significant improvement. Her O2 sats remain borderline. Breathing still appears labored. Will give IV Solu-Medrol, start on continuous neb. Blood work obtained. Chest x-ray has resulted and is normal.  3:35 PM Patient about half way through continuous neb.  She has had some improvement with the wheezes, continues to have expiratory wheezes throughout. Labwork is still pending at this time.  Care will be signed out to oncoming provider.  If lung sounds clear and patient able to ambulate without hypoxia, can likely be discharged with prednisone taper and albuterol inhaler.  If not improving or not able to maintain O2 sats, will need admission for asthma exacerbation.  Final Clinical Impressions(s) / ED Diagnoses   Final diagnoses:  Wheezing    Shortness of breath  [redacted] weeks gestation of pregnancy    New Prescriptions New Prescriptions   No medications on file     Garlon Hatchet, Cordelia Poche 03/02/17 1554    Lorre Nick, MD 03/05/17 [phone]

## 2017-03-18 ENCOUNTER — Other Ambulatory Visit (HOSPITAL_COMMUNITY)
Admission: RE | Admit: 2017-03-18 | Discharge: 2017-03-18 | Disposition: A | Discharge status: Home / Self Care | Payer: Medicaid Other | Source: Ambulatory Visit | Attending: Certified Nurse Midwife | Admitting: Certified Nurse Midwife

## 2017-03-18 ENCOUNTER — Encounter: Payer: Self-pay | Admitting: Certified Nurse Midwife

## 2017-03-18 ENCOUNTER — Ambulatory Visit (INDEPENDENT_AMBULATORY_CARE_PROVIDER_SITE_OTHER): Payer: Medicaid Other | Admitting: Certified Nurse Midwife

## 2017-03-18 VITALS — BP 126/82 | HR 88 | Temp 99.5°F | Wt 332.8 lb

## 2017-03-18 DIAGNOSIS — O34219 Maternal care for unspecified type scar from previous cesarean delivery: Secondary | ICD-10-CM | POA: Diagnosis not present

## 2017-03-18 DIAGNOSIS — O09299 Supervision of pregnancy with other poor reproductive or obstetric history, unspecified trimester: Secondary | ICD-10-CM

## 2017-03-18 DIAGNOSIS — O0990 Supervision of high risk pregnancy, unspecified, unspecified trimester: Secondary | ICD-10-CM | POA: Insufficient documentation

## 2017-03-18 DIAGNOSIS — Z889 Allergy status to unspecified drugs, medicaments and biological substances status: Secondary | ICD-10-CM

## 2017-03-18 DIAGNOSIS — O30009 Twin pregnancy, unspecified number of placenta and unspecified number of amniotic sacs, unspecified trimester: Secondary | ICD-10-CM | POA: Insufficient documentation

## 2017-03-18 DIAGNOSIS — O99512 Diseases of the respiratory system complicating pregnancy, second trimester: Secondary | ICD-10-CM

## 2017-03-18 DIAGNOSIS — J45901 Unspecified asthma with (acute) exacerbation: Secondary | ICD-10-CM | POA: Insufficient documentation

## 2017-03-18 DIAGNOSIS — Z9189 Other specified personal risk factors, not elsewhere classified: Secondary | ICD-10-CM

## 2017-03-18 DIAGNOSIS — O09292 Supervision of pregnancy with other poor reproductive or obstetric history, second trimester: Secondary | ICD-10-CM | POA: Diagnosis not present

## 2017-03-18 DIAGNOSIS — A5901 Trichomonal vulvovaginitis: Secondary | ICD-10-CM | POA: Diagnosis not present

## 2017-03-18 DIAGNOSIS — O30002 Twin pregnancy, unspecified number of placenta and unspecified number of amniotic sacs, second trimester: Secondary | ICD-10-CM | POA: Diagnosis not present

## 2017-03-18 DIAGNOSIS — B488 Other specified mycoses: Secondary | ICD-10-CM | POA: Insufficient documentation

## 2017-03-18 DIAGNOSIS — Z98891 History of uterine scar from previous surgery: Secondary | ICD-10-CM

## 2017-03-18 DIAGNOSIS — J4541 Moderate persistent asthma with (acute) exacerbation: Secondary | ICD-10-CM

## 2017-03-18 MED ORDER — ASPIRIN 81 MG PO CHEW: 81.0000 mg | CHEWABLE_TABLET | Freq: Every day | ORAL | 5 refills | Status: DC

## 2017-03-18 MED ORDER — PRENATE PIXIE 10-0.6-0.4-200 MG PO CAPS: 1.0000 | ORAL_CAPSULE | Freq: Every day | ORAL | 12 refills | Status: DC

## 2017-03-18 MED ORDER — BECLOMETHASONE DIPROP HFA 40 MCG/ACT IN AERB: 2.0000 | INHALATION_SPRAY | Freq: Two times a day (BID) | RESPIRATORY_TRACT | 99 refills | Status: DC

## 2017-03-18 NOTE — Progress Notes (Signed)
Patient states that she has round ligament pain and thinks she feels fetal flutter movements.

## 2017-03-18 NOTE — Progress Notes (Signed)
Subjective:    Denise Harmon is being seen today for her first obstetrical visit.  This is not a planned pregnancy. She is at [redacted]w[redacted]d gestation. Her obstetrical history is significant for obesity, pre-eclampsia and uncontrolled asthma, boils, morbid obesity, close spaced pregnancies, multiple C-sections. . Relationship with FOB: significant other, living together. Patient does intend to breast feed. Pregnancy history fully reviewed.  The information documented in the HPI was reviewed and verified.  Menstrual History:  Patient's last menstrual period was 11/15/2016.    Past Medical History:  Diagnosis Date  . Anxiety   . Depression   . Diabetes mellitus without complication (HCC)   . PTSD (post-traumatic stress disorder)     Past Surgical History:  Procedure Laterality Date  . CESAREAN SECTION    . MULTIPLE TOOTH EXTRACTIONS    . SHOULDER SURGERY       (Not in a hospital admission) Allergies  Allergen Reactions  . Naproxen Hives    Social History  Substance Use Topics  . Smoking status: Former Smoker    Quit date: 12/09/2016  . Smokeless tobacco: Former Neurosurgeon  . Alcohol use No    Family History  Problem Relation Age of Onset  . Diabetes Mellitus II Mother   . Diabetes Mellitus II Father   . Hypertension Father   . Asthma Sister   . Colon cancer Maternal Grandmother      Review of Systems Constitutional: negative for weight loss Gastrointestinal: negative for vomiting Genitourinary:negative for genital lesions and vaginal discharge and dysuria Musculoskeletal:negative for back pain Behavioral/Psych: negative for abusive relationship, depression, illegal drug usage and tobacco use    Objective:    BP 126/82   Pulse 88   Temp 99.5 F (37.5 C)   Wt (!) 332 lb 12.8 oz (151 kg)   LMP 11/15/2016   BMI 55.38 kg/m  General Appearance:    Alert, cooperative, no distress, appears stated age  Head:    Normocephalic, without obvious abnormality, atraumatic   Eyes:    PERRL, conjunctiva/corneas clear, EOM's intact, fundi    benign, both eyes  Ears:    Normal TM's and external ear canals, both ears  Nose:   Nares normal, septum midline, mucosa normal, no drainage    or sinus tenderness  Throat:   Lips, mucosa, and tongue normal; teeth and gums normal  Neck:   Supple, symmetrical, trachea midline, no adenopathy;    thyroid:  no enlargement/tenderness/nodules; no carotid   bruit or JVD  Back:     Symmetric, no curvature, ROM normal, no CVA tenderness  Lungs:     Clear to auscultation bilaterally, respirations unlabored  Chest Wall:    No tenderness or deformity   Heart:    Regular rate and rhythm, S1 and S2 normal, no murmur, rub   or gallop  Breast Exam:    No tenderness, masses, or nipple abnormality  Abdomen:     Soft, non-tender, bowel sounds active all four quadrants,    no masses, no organomegaly  Genitalia:    Normal female without lesion, discharge or tenderness  Extremities:   Extremities normal, atraumatic, no cyanosis or edema  Pulses:   2+ and symmetric all extremities  Skin:   Skin color, texture, turgor normal, no rashes or lesions  Lymph nodes:   Cervical, supraclavicular, and axillary nodes normal  Neurologic:   CNII-XII intact, normal strength, sensation and reflexes    throughout     Cervix: long thick, closed.  FHR: 166 by  doppler.      Lab Review Urine pregnancy test Labs reviewed yes Radiologic studies reviewed yes Assessment:    Pregnancy at [redacted]w[redacted]d weeks     Supervision of high risk pregnancy, antepartum - Plan: Cytology - PAP, Cervicovaginal ancillary only, Culture, OB Urine, Hemoglobinopathy evaluation, Vitamin D (25 hydroxy), Cystic Fibrosis Mutation 97, Obstetric Panel, Including HIV, Varicella zoster antibody, IgG, [place] MFM OB DETAIL +14 WK, [place] MFM OB DETAIL ADDL GEST +14 WK, Hemoglobin A1c, Ambulatory referral to Dermatology, Prenat-FeAsp-Meth-FA-DHA w/o A (PRENATE PIXIE) 10-0.6-0.4-200 MG CAPS, CANCELED: Hemoglobin  A1c  Twin gestation in second trimester, unspecified multiple gestation type - Plan: [place] MFM OB DETAIL +14 WK, [place] MFM OB DETAIL ADDL GEST +14 WK  Moderate persistent asthma with exacerbation - Plan: Beclomethasone Diprop HFA (QVAR REDIHALER) 40 MCG/ACT AERB, Ambulatory referral to Pulmonology  Hx of preeclampsia, prior pregnancy, currently pregnant - Plan: aspirin 81 MG chewable tablet  H/O multiple allergies - Plan: Ambulatory referral to Allergy  History of C-section : desires repeat C-section   Close spaced pregnancies Recent hx of pneumonia Hx of GDM: A1C today Plan:      Prenatal vitamins.  Counseling provided regarding continued use of seat belts, cessation of alcohol consumption, smoking or use of illicit drugs; infection precautions i.e., influenza/TDAP immunizations, toxoplasmosis,CMV, parvovirus, listeria and varicella; workplace safety, exercise during pregnancy; routine dental care, safe medications, sexual activity, hot tubs, saunas, pools, travel, caffeine use, fish and methlymercury, potential toxins, hair treatments, varicose veins Weight gain recommendations per IOM guidelines reviewed: underweight/BMI< 18.5--> gain 28 - 40 lbs; normal weight/BMI 18.5 - 24.9--> gain 25 - 35 lbs; overweight/BMI 25 - 29.9--> gain 15 - 25 lbs; obese/BMI >30->gain  11 - 20 lbs Problem list reviewed and updated. FIRST/CF mutation testing/NIPT/QUAD SCREEN/fragile X/Ashkenazi Jewish population testing/Spinal muscular atrophy discussed: ordered. Role of ultrasound in pregnancy discussed; fetal survey: ordered. Amniocentesis discussed: not indicated.  Meds ordered this encounter  Medications  . Prenatal Vit-Fe Fumarate-FA (MULTIVITAMIN-PRENATAL) 27-0.8 MG TABS tablet    Sig: Take 1 tablet by mouth daily at 12 noon.  . Beclomethasone Diprop HFA (QVAR REDIHALER) 40 MCG/ACT AERB    Sig: Inhale 2 puffs into the lungs 2 (two) times daily.    Dispense:  10.6 g    Refill:  PRN  . aspirin 81 MG  chewable tablet    Sig: Chew 1 tablet (81 mg total) by mouth daily.    Dispense:  30 tablet    Refill:  5  . Prenat-FeAsp-Meth-FA-DHA w/o A (PRENATE PIXIE) 10-0.6-0.4-200 MG CAPS    Sig: Take 1 tablet by mouth daily.    Dispense:  30 capsule    Refill:  12    Please process coupon: Rx BIN: [id number], RxPCN: [id number], RxGRP: [id number], RxID: [id number]  SUF: 01   Orders Placed This Encounter  Procedures  . Culture, OB Urine  . [place] MFM OB DETAIL +14 WK    Standing Status:   Future    Standing Expiration Date:   05/18/2018    Order Specific Question:   Reason for Exam (SYMPTOM  OR DIAGNOSIS REQUIRED)    Answer:   fetal anatomy scan, hx of PIH and GDM    Order Specific Question:   Preferred imaging location?    Answer:   [facility]Ultrasound  . [place] MFM OB DETAIL ADDL GEST +14 WK    Standing Status:   Future    Standing Expiration Date:   05/18/2018    Order Specific Question:   Reason for  Exam (SYMPTOM  OR DIAGNOSIS REQUIRED)    Answer:   twin gestation, hx of PIH, GDM    Order Specific Question:   Preferred imaging location?    Answer:   [facility]Ultrasound  . Hemoglobinopathy evaluation  . Vitamin D (25 hydroxy)  . Cystic Fibrosis Mutation 97  . Obstetric Panel, Including HIV  . Varicella zoster antibody, IgG  . Hemoglobin A1c  . Ambulatory referral to Pulmonology    Referral Priority:   Urgent    Referral Type:   Consultation    Referral Reason:   Specialty Services Required    Requested Specialty:   Pulmonary Disease    Number of Visits Requested:   1  . Ambulatory referral to Dermatology    Referral Priority:   Routine    Referral Type:   Consultation    Referral Reason:   Specialty Services Required    Requested Specialty:   Dermatology    Number of Visits Requested:   1  . Ambulatory referral to Allergy    Referral Priority:   Routine    Referral Type:   Allergy Testing    Referral Reason:   Specialty Services Required    Requested Specialty:   Allergy    Number of Visits Requested:    1    Follow up in 4 weeks. 50% of 45 min visit spent on counseling and coordination of care.

## 2017-03-19 LAB — CERVICOVAGINAL ANCILLARY ONLY
BACTERIAL VAGINITIS: NEGATIVE
CHLAMYDIA, DNA PROBE: NEGATIVE
Candida vaginitis: POSITIVE — AB
Neisseria Gonorrhea: NEGATIVE
Trichomonas: POSITIVE — AB

## 2017-03-20 LAB — URINE CULTURE, OB REFLEX

## 2017-03-20 LAB — CYTOLOGY - PAP: DIAGNOSIS: NEGATIVE

## 2017-03-20 LAB — CULTURE, OB URINE

## 2017-03-21 ENCOUNTER — Other Ambulatory Visit: Payer: Self-pay | Admitting: Certified Nurse Midwife

## 2017-03-21 DIAGNOSIS — A5901 Trichomonal vulvovaginitis: Secondary | ICD-10-CM | POA: Insufficient documentation

## 2017-03-21 DIAGNOSIS — B373 Candidiasis of vulva and vagina: Secondary | ICD-10-CM

## 2017-03-21 DIAGNOSIS — B3731 Acute candidiasis of vulva and vagina: Secondary | ICD-10-CM

## 2017-03-21 DIAGNOSIS — O23592 Infection of other part of genital tract in pregnancy, second trimester: Secondary | ICD-10-CM

## 2017-03-21 DIAGNOSIS — O0990 Supervision of high risk pregnancy, unspecified, unspecified trimester: Secondary | ICD-10-CM

## 2017-03-21 MED ORDER — TERCONAZOLE 0.8 % VA CREA: 1.0000 | TOPICAL_CREAM | Freq: Every day | VAGINAL | 0 refills | Status: DC

## 2017-03-21 MED ORDER — METRONIDAZOLE 500 MG PO TABS: 2000.0000 mg | ORAL_TABLET | Freq: Once | ORAL | 0 refills | Status: AC

## 2017-03-21 MED ORDER — FLUCONAZOLE 150 MG PO TABS: 150.0000 mg | ORAL_TABLET | Freq: Once | ORAL | 0 refills | Status: AC

## 2017-03-25 LAB — VITAMIN D 25 HYDROXY (VIT D DEFICIENCY, FRACTURES): VIT D 25 HYDROXY: 17.9 ng/mL — AB (ref 30.0–100.0)

## 2017-03-25 LAB — OBSTETRIC PANEL, INCLUDING HIV
ANTIBODY SCREEN: NEGATIVE
Basophils Absolute: 0 10*3/uL (ref 0.0–0.2)
Basos: 0 %
EOS (ABSOLUTE): 0.7 10*3/uL — AB (ref 0.0–0.4)
Eos: 5 %
HEP B S AG: NEGATIVE
HIV SCREEN 4TH GENERATION: NONREACTIVE
Hematocrit: 36 % (ref 34.0–46.6)
Hemoglobin: 11.9 g/dL (ref 11.1–15.9)
Immature Grans (Abs): 0.1 10*3/uL (ref 0.0–0.1)
Immature Granulocytes: 1 %
LYMPHS ABS: 3.2 10*3/uL — AB (ref 0.7–3.1)
LYMPHS: 23 %
MCH: 26.7 pg (ref 26.6–33.0)
MCHC: 33.1 g/dL (ref 31.5–35.7)
MCV: 81 fL (ref 79–97)
MONOS ABS: 0.7 10*3/uL (ref 0.1–0.9)
Monocytes: 5 %
NEUTROS PCT: 66 %
Neutrophils Absolute: 9.6 10*3/uL — ABNORMAL HIGH (ref 1.4–7.0)
PLATELETS: 378 10*3/uL (ref 150–379)
RBC: 4.45 x10E6/uL (ref 3.77–5.28)
RDW: 16.5 % — ABNORMAL HIGH (ref 12.3–15.4)
RH TYPE: POSITIVE
RPR: NONREACTIVE
RUBELLA: 5.66 {index} (ref >0.99)
WBC: 14.3 10*3/uL — ABNORMAL HIGH (ref 3.4–10.8)

## 2017-03-25 LAB — HEMOGLOBINOPATHY EVALUATION
HEMOGLOBIN F QUANTITATION: 0 % (ref 0.0–2.0)
HGB C: 0 %
HGB S: 0 %
HGB VARIANT: 0 %
Hemoglobin A2 Quantitation: 2.5 % (ref 1.8–3.2)
Hgb A: 97.5 % (ref 96.4–98.8)

## 2017-03-25 LAB — VARICELLA ZOSTER ANTIBODY, IGG: VARICELLA: 824 {index} (ref >165)

## 2017-03-25 LAB — HEMOGLOBIN A1C
Est. average glucose Bld gHb Est-mCnc: 123 mg/dL
Hgb A1c MFr Bld: 5.9 % — ABNORMAL HIGH (ref 4.8–5.6)

## 2017-03-25 LAB — CYSTIC FIBROSIS MUTATION 97: GENE DIS ANAL CARRIER INTERP BLD/T-IMP: NOT DETECTED

## 2017-03-26 ENCOUNTER — Other Ambulatory Visit: Payer: Self-pay | Admitting: Certified Nurse Midwife

## 2017-03-26 DIAGNOSIS — O0990 Supervision of high risk pregnancy, unspecified, unspecified trimester: Secondary | ICD-10-CM

## 2017-03-26 DIAGNOSIS — R7989 Other specified abnormal findings of blood chemistry: Secondary | ICD-10-CM | POA: Insufficient documentation

## 2017-03-26 DIAGNOSIS — R7309 Other abnormal glucose: Secondary | ICD-10-CM

## 2017-03-26 MED ORDER — VITAMIN D (ERGOCALCIFEROL) 1.25 MG (50000 UNIT) PO CAPS: 50000.0000 [IU] | ORAL_CAPSULE | ORAL | 2 refills | Status: AC

## 2017-03-28 ENCOUNTER — Ambulatory Visit (HOSPITAL_COMMUNITY): Payer: Medicaid Other

## 2017-04-01 ENCOUNTER — Other Ambulatory Visit: Payer: Self-pay | Admitting: Certified Nurse Midwife

## 2017-04-01 DIAGNOSIS — O0990 Supervision of high risk pregnancy, unspecified, unspecified trimester: Secondary | ICD-10-CM

## 2017-04-04 ENCOUNTER — Ambulatory Visit (HOSPITAL_COMMUNITY): Payer: Medicaid Other

## 2017-04-15 ENCOUNTER — Other Ambulatory Visit: Payer: Self-pay | Admitting: Certified Nurse Midwife

## 2017-04-15 ENCOUNTER — Ambulatory Visit (INDEPENDENT_AMBULATORY_CARE_PROVIDER_SITE_OTHER): Payer: Medicaid Other | Admitting: Obstetrics and Gynecology

## 2017-04-15 VITALS — BP 129/70 | HR 102 | Wt 341.0 lb

## 2017-04-15 DIAGNOSIS — O30002 Twin pregnancy, unspecified number of placenta and unspecified number of amniotic sacs, second trimester: Secondary | ICD-10-CM

## 2017-04-15 DIAGNOSIS — Z98891 History of uterine scar from previous surgery: Secondary | ICD-10-CM

## 2017-04-15 DIAGNOSIS — O34219 Maternal care for unspecified type scar from previous cesarean delivery: Secondary | ICD-10-CM | POA: Diagnosis not present

## 2017-04-15 DIAGNOSIS — O0990 Supervision of high risk pregnancy, unspecified, unspecified trimester: Secondary | ICD-10-CM

## 2017-04-15 DIAGNOSIS — B3731 Acute candidiasis of vulva and vagina: Secondary | ICD-10-CM

## 2017-04-15 DIAGNOSIS — B373 Candidiasis of vulva and vagina: Secondary | ICD-10-CM

## 2017-04-15 MED ORDER — METRONIDAZOLE 500 MG PO TABS
500.0000 mg | ORAL_TABLET | Freq: Two times a day (BID) | ORAL | 0 refills | Status: DC
Start: 2017-04-15 — End: 2017-05-13

## 2017-04-15 NOTE — Progress Notes (Signed)
Patient reorts possibly feeling fetal flutter movements, and complains of occassional low back and hip pain.

## 2017-04-15 NOTE — Progress Notes (Signed)
   PRENATAL VISIT NOTE  Subjective:  Denise Harmon is a 27 y.o. [phone] at [redacted]w[redacted]d being seen today for ongoing prenatal care.  She is currently monitored for the following issues for this high-risk pregnancy and has CAP (community acquired pneumonia); Bronchitis; Acute respiratory failure with hypoxia (HCC); Pregnancy; Hypokalemia; Sepsis (HCC); Bleeding; Supervision of high risk pregnancy, antepartum; Twin pregnancy; Asthma exacerbation; History of C-section; Morbid obesity (HCC); Trichomonal vaginitis during pregnancy in second trimester; Low vitamin D level; and Elevated hemoglobin A1c measurement on her problem list.  Patient reports back pain not relieved by support belt.  Contractions: Not present. Vag. Bleeding: None.  Movement: Present. Denies leaking of fluid.   The following portions of the patient's history were reviewed and updated as appropriate: allergies, current medications, past family history, past medical history, past social history, past surgical history and problem list. Problem list updated.  Objective:   Vitals:   04/15/17 1321  BP: 129/70  Pulse: (!) 102  Weight: (!) 341 lb (154.7 kg)    Fetal Status: Fetal Heart Rate (bpm): + on sono   Movement: Present     General:  Alert, oriented and cooperative. Patient is in no acute distress.  Skin: Skin is warm and dry. No rash noted.   Cardiovascular: Normal heart rate noted  Respiratory: Normal respiratory effort, no problems with respiration noted  Abdomen: Soft, gravid, appropriate for gestational age. Pain/Pressure: Absent     Pelvic:  Cervical exam deferred        Extremities: Normal range of motion.  Edema: None  Mental Status: Normal mood and affect. Normal behavior. Normal judgment and thought content.   Assessment and Plan:  Pregnancy: [id number] at [redacted]w[redacted]d  1. Twin gestation in second trimester, unspecified multiple gestation type Demise of twin B documented on 3/3 sono Follow up anatomy on 4/18  2.  Morbid obesity (HCC)   3. History of C-section Previous c-section x 3- will be scheduled for repeat  4. Supervision of high risk pregnancy, antepartum Patient is doing well Discussed elevated Hg A1c and need for early screening test Patient with h/o GDM with last 2 pregnancies Patient was not able to complete flagyl course due to a loss in her Rx. New Rx provided  General obstetric precautions including but not limited to vaginal bleeding, contractions, leaking of fluid and fetal movement were reviewed in detail with the patient. Please refer to After Visit Summary for other counseling recommendations.  Return in about 4 weeks (around 05/13/2017) for ROB.   Catalina Antigua, MD

## 2017-04-16 ENCOUNTER — Other Ambulatory Visit: Payer: Medicaid Other

## 2017-04-17 ENCOUNTER — Ambulatory Visit (HOSPITAL_COMMUNITY): Payer: Medicaid Other

## 2017-04-17 ENCOUNTER — Encounter (HOSPITAL_COMMUNITY): Payer: Medicaid Other

## 2017-04-23 ENCOUNTER — Ambulatory Visit (HOSPITAL_COMMUNITY)
Admission: RE | Admit: 2017-04-23 | Discharge: 2017-04-23 | Disposition: A | Discharge status: Home / Self Care | Payer: Medicaid Other | Source: Ambulatory Visit | Attending: Certified Nurse Midwife | Admitting: Certified Nurse Midwife

## 2017-04-23 ENCOUNTER — Encounter (HOSPITAL_COMMUNITY): Payer: Self-pay

## 2017-04-23 ENCOUNTER — Other Ambulatory Visit: Payer: Self-pay | Admitting: Certified Nurse Midwife

## 2017-04-23 VITALS — BP 112/49 | HR 99 | Wt 343.2 lb

## 2017-04-23 DIAGNOSIS — Z3A20 20 weeks gestation of pregnancy: Secondary | ICD-10-CM

## 2017-04-23 DIAGNOSIS — O34219 Maternal care for unspecified type scar from previous cesarean delivery: Secondary | ICD-10-CM | POA: Diagnosis not present

## 2017-04-23 DIAGNOSIS — O99212 Obesity complicating pregnancy, second trimester: Secondary | ICD-10-CM

## 2017-04-23 DIAGNOSIS — O24419 Gestational diabetes mellitus in pregnancy, unspecified control: Secondary | ICD-10-CM

## 2017-04-23 DIAGNOSIS — O0992 Supervision of high risk pregnancy, unspecified, second trimester: Secondary | ICD-10-CM | POA: Insufficient documentation

## 2017-04-23 DIAGNOSIS — O30002 Twin pregnancy, unspecified number of placenta and unspecified number of amniotic sacs, second trimester: Secondary | ICD-10-CM

## 2017-04-23 DIAGNOSIS — IMO0002 Reserved for concepts with insufficient information to code with codable children: Secondary | ICD-10-CM

## 2017-04-23 DIAGNOSIS — Z818 Family history of other mental and behavioral disorders: Secondary | ICD-10-CM

## 2017-04-23 DIAGNOSIS — O09892 Supervision of other high risk pregnancies, second trimester: Secondary | ICD-10-CM

## 2017-04-23 DIAGNOSIS — Z0489 Encounter for examination and observation for other specified reasons: Secondary | ICD-10-CM

## 2017-04-23 DIAGNOSIS — O0990 Supervision of high risk pregnancy, unspecified, unspecified trimester: Secondary | ICD-10-CM

## 2017-04-23 NOTE — Progress Notes (Signed)
Genetic Counseling  High-Risk Gestation Note  Appointment Date:  04/23/2017 Referred By: Morene Crocker, CNM Date of Birth:  [birth year]   Pregnancy History: [id number] Estimated Date of Delivery: 09/10/17 Estimated Gestational Age: 30w0dAttending: PBenjaman Lobe MD   I met with Denise. Denise Roordafor genetic counseling because previous noninvasive prenatal screening (NIPS) yielded no results due to uninformative DNA pattern.   In summary:  Reviewed previous NIPS (Panorama) drawn 03/18/17 yielded no results due to uninformative DNA pattern  Discussed complicating factor of demise of one twin earlier in pregnancy (noted on 03/02/17 ultrasound)  Reasons for uninformative DNA pattern can include underlying atypical chromosome finding for one or both twins, underlying maternal chromosome issue, and normal variation  Reviewed patient is low risk for fetal aneuploidy based on age alone  Discussed options of screening / testing  Repeat NIPS using different methodology- reviewed limitations due to previous vanishing twin which can increase false positive rate of screen  Patient elected to pursue NIPS today ([facility])  Ultrasound- performed today  Discussed general population carrier screening options  CF- within normal limits through OB  SMA- elected to pursue today  [facility]- [facility] Denise .CAritzel Krusemarkpreviously noninvasive prenatal screening (NIPS)/cell free DNA testing, [facility], which did not yield results due to uninformative DNA pattern.  We reviewed the methodology of NIPS and the conditions for which it screens. Denise. GFalwellcurrent pregnancy began as a dichorionic diamniotic twin gestation, and the demise of one twin was noted on 03/02/17 ultrasound. We reviewed chromosomes, nondisjunction, and examples of fetal aneuploidy. Given the patient's age alone, the  pregnancy is considered low risk for fetal aneuploidy.   We discussed that uninformative [facility] can be caused be a variety of reasons and that a vanishing twin complicates the analysis on this screen. We discussed that uninformative DNA pattern can be due to underlying chromosome aberrations in one of both twins that may be outside the scope of the test, underlying maternal chromosome issue, or normal variation. We discussed that data is not clear regarding how long cfDNA from a vanishing twin can remain in maternal circulation. We discussed the option of repeating NIPS using a different laboratory with different methodology, particularly given the increased time from when the vanishing twin was discovered. However, we reviewed limitations, including that this can increase the false positive rate of this screen. We discussed the diagnostic testing option of amniocentesis including the risks, benefits, and limitations. We discussed the associated 1 in 3622-297risk for complications for amniocentesis, including spontaneous pregnancy loss. After careful consideration, Denise Harmon. She declined amniocentesis.   We reviewed the benefits and limitations of ultrasound as a screening tool. Detailed ultrasound was performed today. Complete ultrasound results under separate cover. Follow-up ultrasound was scheduled.   The family histories were otherwise found to be contributory for history of learning disability for Denise. GShrider her partner, and the father of the pregnancy's father. The patient reported that her learning disability may possibly be related to her personal history of being born at 218weeks gestation. She also reported that the couple's son, currently 154 monthsold, has developmental delays. Denise. CRichele Strandwas counseled that there are many different causes of intellectual disabilities including  environmental, multifactorial, and genetic etiologies.  We discussed that a specific diagnosis for intellectual disability can be determined  in approximately 50% of these individuals.  In the remaining 50% of individuals, a diagnosis may never be determined.  In the absence of an identified cause, recurrence risk estimated is difficult to quantify and prenatal screening or testing would not typically be informative. We discussed that the family history would be expected to increase recurrence risk for learning disabilities for the current pregnancy and that it would be helpful to inform the pediatrician of this history so that children's development can be screened appropriately.  We discussed that without more specific information, it is difficult to provide an accurate risk assessment.  Further genetic counseling is warranted if more information is obtained.  Denise. Baylynn Shifflett was provided with written information regarding cystic fibrosis (CF), spinal muscular atrophy (SMA) and hemoglobinopathies including the carrier frequency, availability of carrier screening and prenatal diagnosis if indicated.  In addition, we discussed that CF and hemoglobinopathies are routinely screened for as part of the [place] newborn screening panel. CF carrier screening and hemoglobin evaluation was previously performed through her OB and was within normal limits.  After further discussion, she elected to pursue SMA carrier screening.  Denise. Eppie Barhorst denied exposure to environmental toxins or chemical agents. She denied the use of alcohol, tobacco or street drugs. She denied significant viral illnesses during the course of her pregnancy.   I counseled Denise Danika Kluender regarding the above risks and available options.  The approximate face-to-face time with the genetic counselor was 30 minutes.  Chipper Oman, Denise Certified Genetic Counselor 04/23/2017

## 2017-04-24 ENCOUNTER — Other Ambulatory Visit: Payer: Self-pay | Admitting: Certified Nurse Midwife

## 2017-04-24 DIAGNOSIS — O0990 Supervision of high risk pregnancy, unspecified, unspecified trimester: Secondary | ICD-10-CM

## 2017-04-26 ENCOUNTER — Other Ambulatory Visit: Payer: Medicaid Other

## 2017-04-28 ENCOUNTER — Other Ambulatory Visit: Payer: Self-pay

## 2017-04-29 ENCOUNTER — Telehealth (HOSPITAL_COMMUNITY): Payer: Self-pay | Admitting: MS"

## 2017-04-29 ENCOUNTER — Other Ambulatory Visit: Payer: Self-pay

## 2017-04-29 NOTE — Telephone Encounter (Signed)
Attempted to contact patient regarding results of noninvasive prenatal screening (NIPS)/prenatal cell free DNA testing results, which are within normal limits. Patient's number has automated voice stating that the number is "not reachable."  Denise Harmon 04/29/2017 11:06 AM

## 2017-04-30 ENCOUNTER — Telehealth (HOSPITAL_COMMUNITY): Payer: Self-pay | Admitting: MS"

## 2017-04-30 NOTE — Telephone Encounter (Signed)
Attempted to contact patient regarding results of Spinal Muscular Atrophy carrier screening, which are within normal limits. Patient's telephone number has automated message that states the number is not reachable.   Denise Harmon 04/30/2017 9:47 AM

## 2017-05-02 ENCOUNTER — Other Ambulatory Visit: Payer: Self-pay | Admitting: Certified Nurse Midwife

## 2017-05-02 DIAGNOSIS — O0990 Supervision of high risk pregnancy, unspecified, unspecified trimester: Secondary | ICD-10-CM

## 2017-05-03 ENCOUNTER — Other Ambulatory Visit: Payer: Self-pay

## 2017-05-06 ENCOUNTER — Telehealth: Payer: Self-pay | Admitting: Obstetrics and Gynecology

## 2017-05-10 ENCOUNTER — Institutional Professional Consult (permissible substitution): Payer: Medicaid Other | Admitting: Internal Medicine

## 2017-05-13 ENCOUNTER — Ambulatory Visit (INDEPENDENT_AMBULATORY_CARE_PROVIDER_SITE_OTHER): Payer: Medicaid Other | Admitting: Obstetrics and Gynecology

## 2017-05-13 VITALS — BP 128/75 | HR 99 | Wt 334.4 lb

## 2017-05-13 DIAGNOSIS — R7309 Other abnormal glucose: Secondary | ICD-10-CM

## 2017-05-13 DIAGNOSIS — O99212 Obesity complicating pregnancy, second trimester: Secondary | ICD-10-CM | POA: Diagnosis not present

## 2017-05-13 DIAGNOSIS — O34219 Maternal care for unspecified type scar from previous cesarean delivery: Secondary | ICD-10-CM

## 2017-05-13 DIAGNOSIS — O0992 Supervision of high risk pregnancy, unspecified, second trimester: Secondary | ICD-10-CM | POA: Diagnosis not present

## 2017-05-13 DIAGNOSIS — O0990 Supervision of high risk pregnancy, unspecified, unspecified trimester: Secondary | ICD-10-CM

## 2017-05-13 DIAGNOSIS — Z98891 History of uterine scar from previous surgery: Secondary | ICD-10-CM

## 2017-05-13 MED ORDER — METRONIDAZOLE 500 MG PO TABS: 500.0000 mg | ORAL_TABLET | Freq: Two times a day (BID) | ORAL | 0 refills | Status: DC

## 2017-05-13 NOTE — Progress Notes (Signed)
    PRENATAL VISIT NOTE  Subjective:  Denise Harmon is a 27 y.o. [phone] at 3749w6d being seen today for ongoing prenatal care.  She is currently monitored for the following issues for this low-risk pregnancy and has CAP (community acquired pneumonia); Bronchitis; Acute respiratory failure with hypoxia (HCC); Pregnancy; Hypokalemia; Sepsis (HCC); Bleeding; Supervision of high risk pregnancy, antepartum; Twin pregnancy; Asthma exacerbation; History of C-section; Morbid obesity (HCC); Trichomonal vaginitis during pregnancy in second trimester; Low vitamin D level; Elevated hemoglobin A1c measurement; and Family history of learning disability on her problem list.  Patient reports no complaints.  Contractions: Not present. Vag. Bleeding: None.  Movement: Present. Denies leaking of fluid.   The following portions of the patient's history were reviewed and updated as appropriate: allergies, current medications, past family history, past medical history, past social history, past surgical history and problem list. Problem list updated.  Objective:   Vitals:   05/13/17 1107  BP: 128/75  Pulse: 99  Weight: (!) 334 lb 6.4 oz (151.7 kg)    Fetal Status:     Movement: Present     General:  Alert, oriented and cooperative. Patient is in no acute distress.  Skin: Skin is warm and dry. No rash noted.   Cardiovascular: Normal heart rate noted  Respiratory: Normal respiratory effort, no problems with respiration noted  Abdomen: Soft, gravid, appropriate for gestational age. Pain/Pressure: Present     Pelvic:  Cervical exam deferred        Extremities: Normal range of motion.  Edema: Trace  Mental Status: Normal mood and affect. Normal behavior. Normal judgment and thought content.   Assessment and Plan:  Pregnancy: G5P3013 at 6849w6d  1. Supervision of high risk pregnancy, antepartum Patient is doing well without complaints Follow up anatomy ultrasound on 6/5   2. History of  C-section Will be scheduled for her 4th c-section  3. Morbid obesity (HCC)   4. Elevated hemoglobin A1c measurement Patient missed her last appointment for glucola due to transportation. Unable to stay for 1 hour today 2 hr glucola at her next appointment  Preterm labor symptoms and general obstetric precautions including but not limited to vaginal bleeding, contractions, leaking of fluid and fetal movement were reviewed in detail with the patient. Please refer to After Visit Summary for other counseling recommendations.  Return in about 4 weeks (around 06/10/2017) for ROB, 2 hr glucola next visit.   Denise Harmon, Gigi GinPeggy, MD

## 2017-05-13 NOTE — Progress Notes (Signed)
[facility] [address] [facility]

## 2017-06-04 ENCOUNTER — Ambulatory Visit (HOSPITAL_COMMUNITY)
Admission: RE | Admit: 2017-06-04 | Discharge: 2017-06-04 | Disposition: A | Discharge status: Home / Self Care | Payer: Medicaid Other | Source: Ambulatory Visit | Attending: Certified Nurse Midwife | Admitting: Certified Nurse Midwife

## 2017-06-04 ENCOUNTER — Encounter (HOSPITAL_COMMUNITY): Payer: Self-pay

## 2017-06-04 ENCOUNTER — Other Ambulatory Visit: Payer: Self-pay | Admitting: Certified Nurse Midwife

## 2017-06-04 ENCOUNTER — Other Ambulatory Visit (HOSPITAL_COMMUNITY): Payer: Self-pay | Admitting: *Deleted

## 2017-06-04 DIAGNOSIS — Z3A28 28 weeks gestation of pregnancy: Secondary | ICD-10-CM | POA: Diagnosis not present

## 2017-06-04 DIAGNOSIS — O3110X0 Continuing pregnancy after spontaneous abortion of one fetus or more, unspecified trimester, not applicable or unspecified: Secondary | ICD-10-CM | POA: Diagnosis not present

## 2017-06-04 DIAGNOSIS — O24419 Gestational diabetes mellitus in pregnancy, unspecified control: Secondary | ICD-10-CM | POA: Insufficient documentation

## 2017-06-04 DIAGNOSIS — O34212 Maternal care for vertical scar from previous cesarean delivery: Secondary | ICD-10-CM | POA: Diagnosis not present

## 2017-06-04 DIAGNOSIS — O0990 Supervision of high risk pregnancy, unspecified, unspecified trimester: Secondary | ICD-10-CM

## 2017-06-04 DIAGNOSIS — Z3A26 26 weeks gestation of pregnancy: Secondary | ICD-10-CM

## 2017-06-04 DIAGNOSIS — O3660X0 Maternal care for excessive fetal growth, unspecified trimester, not applicable or unspecified: Secondary | ICD-10-CM

## 2017-06-04 DIAGNOSIS — O99212 Obesity complicating pregnancy, second trimester: Secondary | ICD-10-CM

## 2017-06-04 DIAGNOSIS — O34219 Maternal care for unspecified type scar from previous cesarean delivery: Secondary | ICD-10-CM

## 2017-06-04 DIAGNOSIS — Z362 Encounter for other antenatal screening follow-up: Secondary | ICD-10-CM | POA: Diagnosis present

## 2017-06-04 DIAGNOSIS — Z6841 Body Mass Index (BMI) 40.0 and over, adult: Secondary | ICD-10-CM | POA: Diagnosis not present

## 2017-06-04 DIAGNOSIS — O30009 Twin pregnancy, unspecified number of placenta and unspecified number of amniotic sacs, unspecified trimester: Secondary | ICD-10-CM | POA: Diagnosis not present

## 2017-06-04 DIAGNOSIS — O09892 Supervision of other high risk pregnancies, second trimester: Secondary | ICD-10-CM | POA: Insufficient documentation

## 2017-06-05 ENCOUNTER — Other Ambulatory Visit: Payer: Self-pay | Admitting: Certified Nurse Midwife

## 2017-06-05 DIAGNOSIS — O0990 Supervision of high risk pregnancy, unspecified, unspecified trimester: Secondary | ICD-10-CM

## 2017-06-10 ENCOUNTER — Other Ambulatory Visit: Payer: Medicaid Other

## 2017-06-10 ENCOUNTER — Ambulatory Visit (INDEPENDENT_AMBULATORY_CARE_PROVIDER_SITE_OTHER): Payer: Medicaid Other | Admitting: Obstetrics and Gynecology

## 2017-06-10 VITALS — BP 123/85 | HR 99 | Wt 333.2 lb

## 2017-06-10 DIAGNOSIS — Z98891 History of uterine scar from previous surgery: Secondary | ICD-10-CM

## 2017-06-10 DIAGNOSIS — O0992 Supervision of high risk pregnancy, unspecified, second trimester: Secondary | ICD-10-CM

## 2017-06-10 DIAGNOSIS — O0990 Supervision of high risk pregnancy, unspecified, unspecified trimester: Secondary | ICD-10-CM

## 2017-06-10 NOTE — Progress Notes (Signed)
   PRENATAL VISIT NOTE  Subjective:  Denise Harmon is a 27 y.o. [phone] at 442w6d being seen today for ongoing prenatal care.  She is currently monitored for the following issues for this low-risk pregnancy and has CAP (community acquired pneumonia); Bronchitis; Acute respiratory failure with hypoxia (HCC); Pregnancy; Hypokalemia; Sepsis (HCC); Bleeding; Supervision of high risk pregnancy, antepartum; Twin pregnancy; Asthma exacerbation; History of C-section; Morbid obesity (HCC); Trichomonal vaginitis during pregnancy in second trimester; Low vitamin D level; Elevated hemoglobin A1c measurement; and Family history of learning disability on her problem list.  Patient reports no complaints.  Contractions: Not present. Vag. Bleeding: None.  Movement: Present. Denies leaking of fluid.   The following portions of the patient's history were reviewed and updated as appropriate: allergies, current medications, past family history, past medical history, past social history, past surgical history and problem list. Problem list updated.  Objective:   Vitals:   06/10/17 0845  BP: 123/85  Pulse: 99  Weight: (!) 333 lb 3.2 oz (151.1 kg)    Fetal Status: Fetal Heart Rate (bpm): 145   Movement: Present     General:  Alert, oriented and cooperative. Patient is in no acute distress.  Skin: Skin is warm and dry. No rash noted.   Cardiovascular: Normal heart rate noted  Respiratory: Normal respiratory effort, no problems with respiration noted  Abdomen: Soft, gravid, appropriate for gestational age. Pain/Pressure: Present     Pelvic:  Cervical exam deferred        Extremities: Normal range of motion.  Edema: None  Mental Status: Normal mood and affect. Normal behavior. Normal judgment and thought content.   Assessment and Plan:  Pregnancy: [id number] at 2242w6d  1. Supervision of high risk pregnancy, antepartum Patient is doing well without complaints 6/5 ultrasound with normal anatomy and  growth Third trimester labs and glucola today  2. History of C-section Will be scheduled for repeat  3. Morbid obesity (HCC)   Preterm labor symptoms and general obstetric precautions including but not limited to vaginal bleeding, contractions, leaking of fluid and fetal movement were reviewed in detail with the patient. Please refer to After Visit Summary for other counseling recommendations.  Return in about 2 weeks (around 06/24/2017) for ROB.   Catalina AntiguaPeggy Kiril Hippe, MD

## 2017-06-10 NOTE — Addendum Note (Signed)
Addended by: Marya LandryFOSTER, Rogelio Waynick D on: 06/10/2017 09:34 AM   Modules accepted: Orders

## 2017-06-11 LAB — CBC
Hematocrit: 35.7 % (ref 34.0–46.6)
Hemoglobin: 11.2 g/dL (ref 11.1–15.9)
MCH: 25.7 pg — AB (ref 26.6–33.0)
MCHC: 31.4 g/dL — AB (ref 31.5–35.7)
MCV: 82 fL (ref 79–97)
PLATELETS: 364 10*3/uL (ref 150–379)
RBC: 4.36 x10E6/uL (ref 3.77–5.28)
RDW: 15.8 % — ABNORMAL HIGH (ref 12.3–15.4)
WBC: 10.1 10*3/uL (ref 3.4–10.8)

## 2017-06-11 LAB — GLUCOSE TOLERANCE, 2 HOURS W/ 1HR
GLUCOSE, 1 HOUR: 142 mg/dL (ref 65–179)
GLUCOSE, 2 HOUR: 116 mg/dL (ref 65–152)
GLUCOSE, FASTING: 90 mg/dL (ref 65–91)

## 2017-06-11 LAB — HIV ANTIBODY (ROUTINE TESTING W REFLEX): HIV SCREEN 4TH GENERATION: NONREACTIVE

## 2017-06-11 LAB — RPR: RPR: NONREACTIVE

## 2017-06-25 ENCOUNTER — Encounter: Payer: Medicaid Other | Admitting: Obstetrics and Gynecology

## 2017-07-16 ENCOUNTER — Ambulatory Visit (HOSPITAL_COMMUNITY)
Admission: RE | Admit: 2017-07-16 | Discharge: 2017-07-16 | Disposition: A | Discharge status: Home / Self Care | Payer: Medicaid Other | Source: Ambulatory Visit | Attending: Certified Nurse Midwife | Admitting: Certified Nurse Midwife

## 2017-07-16 ENCOUNTER — Other Ambulatory Visit: Payer: Self-pay | Admitting: Certified Nurse Midwife

## 2017-07-16 ENCOUNTER — Encounter (HOSPITAL_COMMUNITY): Payer: Self-pay

## 2017-07-16 DIAGNOSIS — O09893 Supervision of other high risk pregnancies, third trimester: Secondary | ICD-10-CM

## 2017-07-16 DIAGNOSIS — O34211 Maternal care for low transverse scar from previous cesarean delivery: Secondary | ICD-10-CM | POA: Diagnosis present

## 2017-07-16 DIAGNOSIS — O09299 Supervision of pregnancy with other poor reproductive or obstetric history, unspecified trimester: Secondary | ICD-10-CM

## 2017-07-16 DIAGNOSIS — O34219 Maternal care for unspecified type scar from previous cesarean delivery: Secondary | ICD-10-CM

## 2017-07-16 DIAGNOSIS — Z362 Encounter for other antenatal screening follow-up: Secondary | ICD-10-CM | POA: Insufficient documentation

## 2017-07-16 DIAGNOSIS — O09293 Supervision of pregnancy with other poor reproductive or obstetric history, third trimester: Secondary | ICD-10-CM | POA: Diagnosis not present

## 2017-07-16 DIAGNOSIS — O3110X0 Continuing pregnancy after spontaneous abortion of one fetus or more, unspecified trimester, not applicable or unspecified: Secondary | ICD-10-CM

## 2017-07-16 DIAGNOSIS — Z8632 Personal history of gestational diabetes: Secondary | ICD-10-CM

## 2017-07-16 DIAGNOSIS — Z3A34 34 weeks gestation of pregnancy: Secondary | ICD-10-CM | POA: Insufficient documentation

## 2017-07-16 DIAGNOSIS — O99213 Obesity complicating pregnancy, third trimester: Secondary | ICD-10-CM

## 2017-07-16 DIAGNOSIS — O0990 Supervision of high risk pregnancy, unspecified, unspecified trimester: Secondary | ICD-10-CM

## 2017-07-16 DIAGNOSIS — Z3A32 32 weeks gestation of pregnancy: Secondary | ICD-10-CM

## 2017-07-22 ENCOUNTER — Other Ambulatory Visit: Payer: Self-pay | Admitting: Certified Nurse Midwife

## 2017-07-23 ENCOUNTER — Encounter (HOSPITAL_COMMUNITY): Payer: Self-pay

## 2017-07-23 ENCOUNTER — Other Ambulatory Visit (HOSPITAL_COMMUNITY)
Admission: RE | Admit: 2017-07-23 | Discharge: 2017-07-23 | Disposition: A | Discharge status: Home / Self Care | Payer: Medicaid Other | Source: Ambulatory Visit | Attending: Obstetrics and Gynecology | Admitting: Obstetrics and Gynecology

## 2017-07-23 ENCOUNTER — Ambulatory Visit (INDEPENDENT_AMBULATORY_CARE_PROVIDER_SITE_OTHER): Payer: Medicaid Other | Admitting: Obstetrics and Gynecology

## 2017-07-23 VITALS — BP 118/69 | HR 87 | Wt 339.8 lb

## 2017-07-23 DIAGNOSIS — O0990 Supervision of high risk pregnancy, unspecified, unspecified trimester: Secondary | ICD-10-CM

## 2017-07-23 DIAGNOSIS — Z23 Encounter for immunization: Secondary | ICD-10-CM | POA: Diagnosis not present

## 2017-07-23 DIAGNOSIS — O0993 Supervision of high risk pregnancy, unspecified, third trimester: Secondary | ICD-10-CM

## 2017-07-23 DIAGNOSIS — Z98891 History of uterine scar from previous surgery: Secondary | ICD-10-CM | POA: Insufficient documentation

## 2017-07-23 NOTE — Addendum Note (Signed)
Addended by: Hamilton CapriBURCH, Matalyn Nawaz J on: 07/23/2017 10:59 AM   Modules accepted: Orders

## 2017-07-23 NOTE — Progress Notes (Signed)
   PRENATAL VISIT NOTE  Subjective:  Denise Harmon is a 27 y.o. [phone] at 2548w6d being seen today for ongoing prenatal care.  She is currently monitored for the following issues for this low-risk pregnancy and has CAP (community acquired pneumonia); Bronchitis; Acute respiratory failure with hypoxia (HCC); Pregnancy; Hypokalemia; Sepsis (HCC); Bleeding; Supervision of high risk pregnancy, antepartum; Twin pregnancy; Asthma exacerbation; History of C-section; Morbid obesity (HCC); Trichomonal vaginitis during pregnancy in second trimester; Low vitamin D level; Elevated hemoglobin A1c measurement; and Family history of learning disability on her problem list.  Patient reports no complaints.  Contractions: Not present. Vag. Bleeding: None.  Movement: Present. Denies leaking of fluid.   The following portions of the patient's history were reviewed and updated as appropriate: allergies, current medications, past family history, past medical history, past social history, past surgical history and problem list. Problem list updated.  Objective:   Vitals:   07/23/17 0951  BP: 118/69  Pulse: 87  Weight: (!) 339 lb 12.8 oz (154.1 kg)    Fetal Status:     Movement: Present     General:  Alert, oriented and cooperative. Patient is in no acute distress.  Skin: Skin is warm and dry. No rash noted.   Cardiovascular: Normal heart rate noted  Respiratory: Normal respiratory effort, no problems with respiration noted  Abdomen: Soft, gravid, appropriate for gestational age.  Pain/Pressure: Present     Pelvic: Cervical exam deferred Dilation: Closed Effacement (%): Thick Station: Ballotable  Extremities: Normal range of motion.  Edema: None  Mental Status:  Normal mood and affect. Normal behavior. Normal judgment and thought content.   Assessment and Plan:  Pregnancy: [id number] at 2548w6d  1. History of C-section Previous c-section x 3 Patient has not been seen in over 9 weeks due to lack of  transportation Will schedule repeat c-section at 39 weeks. She plans Nexplanon for contraception  2. Morbid obesity (HCC)   Preterm labor symptoms and general obstetric precautions including but not limited to vaginal bleeding, contractions, leaking of fluid and fetal movement were reviewed in detail with the patient. Please refer to After Visit Summary for other counseling recommendations.  Return in about 1 week (around 07/30/2017) for ROB.   Catalina AntiguaPeggy Kennidi Yoshida, MD

## 2017-07-24 LAB — GC/CHLAMYDIA PROBE AMP (~~LOC~~) NOT AT ARMC
CHLAMYDIA, DNA PROBE: NEGATIVE
NEISSERIA GONORRHEA: NEGATIVE

## 2017-07-25 ENCOUNTER — Encounter: Payer: Self-pay | Admitting: Obstetrics and Gynecology

## 2017-07-25 DIAGNOSIS — O99820 Streptococcus B carrier state complicating pregnancy: Secondary | ICD-10-CM | POA: Insufficient documentation

## 2017-07-25 LAB — STREP GP B NAA: Strep Gp B NAA: POSITIVE — AB

## 2017-07-29 ENCOUNTER — Inpatient Hospital Stay (HOSPITAL_COMMUNITY)
Admission: AD | Admit: 2017-07-29 | Discharge: 2017-07-29 | Disposition: A | Discharge status: Home / Self Care | Payer: Medicaid Other | Source: Ambulatory Visit | Attending: Obstetrics & Gynecology | Admitting: Obstetrics & Gynecology

## 2017-07-29 ENCOUNTER — Encounter (HOSPITAL_COMMUNITY): Payer: Self-pay | Admitting: *Deleted

## 2017-07-29 DIAGNOSIS — O26893 Other specified pregnancy related conditions, third trimester: Secondary | ICD-10-CM | POA: Diagnosis not present

## 2017-07-29 DIAGNOSIS — M545 Low back pain: Secondary | ICD-10-CM

## 2017-07-29 DIAGNOSIS — R300 Dysuria: Secondary | ICD-10-CM

## 2017-07-29 DIAGNOSIS — Z87891 Personal history of nicotine dependence: Secondary | ICD-10-CM | POA: Diagnosis not present

## 2017-07-29 DIAGNOSIS — Z3A37 37 weeks gestation of pregnancy: Secondary | ICD-10-CM | POA: Insufficient documentation

## 2017-07-29 LAB — URINALYSIS, ROUTINE W REFLEX MICROSCOPIC
BILIRUBIN URINE: NEGATIVE
Glucose, UA: NEGATIVE mg/dL
KETONES UR: NEGATIVE mg/dL
Nitrite: NEGATIVE
PROTEIN: 30 mg/dL — AB
Specific Gravity, Urine: 1.02 (ref 1.005–1.030)
pH: 5 (ref 5.0–8.0)

## 2017-07-29 MED ORDER — PHENAZOPYRIDINE HCL 200 MG PO TABS: 200.0000 mg | ORAL_TABLET | Freq: Three times a day (TID) | ORAL | 0 refills | Status: AC

## 2017-07-29 MED ORDER — CEPHALEXIN 500 MG PO CAPS: 500.0000 mg | ORAL_CAPSULE | Freq: Two times a day (BID) | ORAL | 0 refills | Status: AC

## 2017-07-29 NOTE — MAU Note (Addendum)
Pt presents to MAU c/o UTI symptoms (burning on urination as well as pressure, and urinary frequency and not being able to completely empty her bladder.) pt saw a little bit of pink tinged blood when she wiped at our facility. Pt denies LOF and says baby has been moving good this week; pt stated baby has not moved as much as she normally does today but pt has felt movement.

## 2017-07-29 NOTE — MAU Provider Note (Signed)
History     CSN: [id number]  Arrival date and time: 07/29/17 [id number]   First Provider Initiated Contact with Patient 07/29/17 1931      Chief Complaint  Patient presents with  . Urinary Tract Infection   She has had symptoms of urgency, dysuria, hip pain, back pain, cramping, occasional incontinence . Symptoms started Saturday (2 days ago). She has not felt ill and denies fevers or chills. To help she has been drinking lots of cranberry juice and water. [facility] would not see her until Thursday, so she came here.     OB History    Gravida Para Term Preterm AB Living   5 3 3   1 3    SAB TAB Ectopic Multiple Live Births   1       3      Past Medical History:  Diagnosis Date  . Anxiety   . Depression   . Diabetes mellitus without complication (HCC)   . PTSD (post-traumatic stress disorder)     Past Surgical History:  Procedure Laterality Date  . CESAREAN SECTION    . MULTIPLE TOOTH EXTRACTIONS    . SHOULDER SURGERY      Family History  Problem Relation Age of Onset  . Diabetes Mellitus II Mother   . Diabetes Mellitus II Father   . Hypertension Father   . Asthma Sister   . Colon cancer Maternal Grandmother     Social History  Substance Use Topics  . Smoking status: Former Smoker    Quit date: 12/09/2016  . Smokeless tobacco: Former NeurosurgeonUser  . Alcohol use No    Allergies:  Allergies  Allergen Reactions  . Naproxen Hives    Prescriptions Prior to Admission  Medication Sig Dispense Refill Last Dose  . Prenatal Vit-Fe Fumarate-FA (MULTIVITAMIN-PRENATAL) 27-0.8 MG TABS tablet Take 1 tablet by mouth daily at 12 noon.   07/28/2017 at Unknown time  . Vitamin D, Ergocalciferol, (DRISDOL) 50000 units CAPS capsule Take 1 capsule (50,000 Units total) by mouth every 7 (seven) days. 30 capsule 2 07/28/2017 at Unknown time    Review of Systems  Constitutional: Negative.  Negative for chills and fever.  HENT: Negative.   Eyes: Negative.   Respiratory: Negative.    Cardiovascular: Negative.   Gastrointestinal: Negative.   Endocrine: Negative.   Genitourinary: Positive for difficulty urinating, dysuria, hematuria, pelvic pain and urgency. Negative for dyspareunia.  Musculoskeletal: Negative.   Skin: Negative.   Allergic/Immunologic: Negative.   Neurological: Negative for dizziness, light-headedness and headaches.  Hematological: Negative.   Psychiatric/Behavioral: Negative.    Physical Exam   Blood pressure (!) 118/48, pulse 93, temperature 97.7 F (36.5 C), temperature source Oral, resp. rate 19, height 5\' 5"  (1.651 m), weight (!) 153.8 kg (339 lb), last menstrual period 11/15/2016.  Physical Exam  Constitutional: She is oriented to person, place, and time. She appears well-developed and well-nourished.  HENT:  Head: Normocephalic and atraumatic.  Eyes: Pupils are equal, round, and reactive to light.  Neck: Normal range of motion.  Cardiovascular: Normal rate, regular rhythm, normal heart sounds and intact distal pulses.   Respiratory: Effort normal and breath sounds normal.  GI: Soft. Bowel sounds are normal.  suprapubic tenderness present. No CVA tenderness.   Genitourinary:  Genitourinary Comments: Uterus: gravid, S>D d/t maternal body habitus, cx: closed/long/firm, no CMT  Musculoskeletal: Normal range of motion.  Neurological: She is alert and oriented to person, place, and time.  Skin: Skin is warm and dry.  Psychiatric: She has  a normal mood and affect. Her behavior is normal. Judgment and thought content normal.    MAU Course  Procedures  MDM CCUA UCx CBC NST - FHR: 145 bpm / moderate variability / accels present / decels absent           TOCO: none  Results for orders placed or performed during the hospital encounter of 07/29/17 (from the past 24 hour(s))  Urinalysis, Routine w reflex microscopic     Status: Abnormal   Collection Time: 07/29/17  7:04 PM  Result Value Ref Range   Color, Urine YELLOW YELLOW   APPearance  CLOUDY (A) CLEAR   Specific Gravity, Urine 1.020 1.005 - 1.030   pH 5.0 5.0 - 8.0   Glucose, UA NEGATIVE NEGATIVE mg/dL   Hgb urine dipstick LARGE (A) NEGATIVE   Bilirubin Urine NEGATIVE NEGATIVE   Ketones, ur NEGATIVE NEGATIVE mg/dL   Protein, ur 30 (A) NEGATIVE mg/dL   Nitrite NEGATIVE NEGATIVE   Leukocytes, UA LARGE (A) NEGATIVE   RBC / HPF TOO NUMEROUS TO COUNT 0 - 5 RBC/hpf   WBC, UA TOO NUMEROUS TO COUNT 0 - 5 WBC/hpf   Bacteria, UA FEW (A) NONE SEEN   Squamous Epithelial / LPF 0-5 (A) NONE SEEN   Mucous PRESENT    Non Squamous Epithelial 0-5 (A) NONE SEEN   Assessment and Plan  Dysuria during pregnancy in third trimester - Rx for Keflex 500 mg po BID x 7 days - Rx for Pyridium 200 mg TID x 2 days - Instructions on dysuria given - [facility]   Discharge home Patient verbalized an understanding of the plan of care and agrees.   Sullivan LoneBrannon L Inman, Medical Student 07/29/2017, 7:34 PM   I confirm that I have verified the information documented in the medical student's note and that I have also personally reperformed the physical exam and all medical decision making activities.   Care of patient assumed by me at [facility] Trevian Hayashida, CNM 07/29/2017 9:02 PM

## 2017-07-30 NOTE — H&P (Signed)
Denise Harmon is an 27 y.o. [phone] 204w6d female.   Chief Complaint: Previous C-section x 3  HPI: at < [redacted] wks pregnant with h/o C-section x 3 for elective repeat.  Past Medical History:  Diagnosis Date  . Anxiety   . Depression   . Diabetes mellitus without complication (HCC)   . PTSD (post-traumatic stress disorder)     Past Surgical History:  Procedure Laterality Date  . CESAREAN SECTION    . MULTIPLE TOOTH EXTRACTIONS    . SHOULDER SURGERY      Family History  Problem Relation Age of Onset  . Diabetes Mellitus II Mother   . Diabetes Mellitus II Father   . Hypertension Father   . Asthma Sister   . Colon cancer Maternal Grandmother    Social History:  reports that she quit smoking about 7 months ago. She has quit using smokeless tobacco. She reports that she does not drink alcohol or use drugs.    Allergies  Allergen Reactions  . Naproxen Hives    No current facility-administered medications on file prior to encounter.    Current Outpatient Prescriptions on File Prior to Encounter  Medication Sig Dispense Refill  . Prenatal Vit-Fe Fumarate-FA (MULTIVITAMIN-PRENATAL) 27-0.8 MG TABS tablet Take 1 tablet by mouth daily at 12 noon.    . Vitamin D, Ergocalciferol, (DRISDOL) 50000 units CAPS capsule Take 1 capsule (50,000 Units total) by mouth every 7 (seven) days. 30 capsule 2    A comprehensive review of systems was negative.  Last menstrual period 11/15/2016. General appearance: alert, cooperative and appears stated age Head: Normocephalic, without obvious abnormality, atraumatic Neck: supple, symmetrical, trachea midline Lungs: normal effort Heart: regular rate and rhythm Abdomen: gravid, Non-tender Extremities: Homans sign is negative, no sign of DVT Skin: Skin color, texture, turgor normal. No rashes or lesions Neurologic: Grossly normal   Lab Results  Component Value Date   WBC 10.1 06/10/2017   HGB 11.2 06/10/2017   HCT 35.7 06/10/2017   MCV  82 06/10/2017   PLT 364 06/10/2017         ABO, Rh: A/Positive/-- (03/19 1649)  Antibody: Negative (03/19 1649)  Rubella: !Error!  RPR: Non Reactive (06/11 1045)  HBsAg: Negative (03/19 1649)  HIV:    GBS: Positive (07/24 1036)     Assessment/Plan Principal Problem:   History of C-section  For elective RCS. Risks include but are not limited to bleeding, infection, injury to surrounding structures, including bowel, bladder and ureters, blood clots, and death.  Likelihood of success is high.   Denise Harmon 07/30/2017, 11:01 AM

## 2017-07-31 ENCOUNTER — Encounter (HOSPITAL_COMMUNITY): Payer: Self-pay

## 2017-08-01 ENCOUNTER — Encounter: Payer: Medicaid Other | Admitting: Obstetrics and Gynecology

## 2017-08-13 NOTE — Patient Instructions (Signed)
20 Denise Harmon [address] Grant  08/13/2017   Your procedure is scheduled on:  08/15/2017  [facility] at 0845 AM.  Pick up the phone at the desk and dial [phone].   Call this number if you have problems the morning of surgery: [phone]   Remember:   Do not eat food:After Midnight.  Do not drink clear liquids: After Midnight.  Take these medicines the morning of surgery with A SIP OF WATER: none   Do not wear jewelry, make-up or nail polish.  Do not wear lotions, powders, or perfumes. Do not wear deodorant.  Do not shave 48 hours prior to surgery.  Do not bring valuables to the hospital.  [facility] is not   responsible for any belongings or valuables brought to the hospital.  Contacts, dentures or bridgework may not be worn into surgery.  Leave suitcase in the car. After surgery it may be brought to your room.  For patients admitted to the hospital, checkout time is 11:00 AM the day of              discharge.   Patients discharged the day of surgery will not be allowed to drive             home.  Name and phone number of your driver: na  Special Instructions:   N/A   Please read over the following fact sheets that you were given:   Surgical Site Infection Prevention

## 2017-08-14 ENCOUNTER — Encounter (HOSPITAL_COMMUNITY)
Admission: RE | Admit: 2017-08-14 | Discharge: 2017-08-14 | Disposition: A | Discharge status: Home / Self Care | Payer: Medicaid Other | Source: Ambulatory Visit | Attending: Family Medicine | Admitting: Family Medicine

## 2017-08-14 LAB — CBC
HEMATOCRIT: 35.1 % — AB (ref 36.0–46.0)
Hemoglobin: 11.2 g/dL — ABNORMAL LOW (ref 12.0–15.0)
MCH: 24.7 pg — AB (ref 26.0–34.0)
MCHC: 31.9 g/dL (ref 30.0–36.0)
MCV: 77.3 fL — AB (ref 78.0–100.0)
Platelets: 340 10*3/uL (ref 150–400)
RBC: 4.54 MIL/uL (ref 3.87–5.11)
RDW: 16.5 % — AB (ref 11.5–15.5)
WBC: 11 10*3/uL — ABNORMAL HIGH (ref 4.0–10.5)

## 2017-08-14 LAB — TYPE AND SCREEN
ABO/RH(D): A POS
ANTIBODY SCREEN: NEGATIVE

## 2017-08-14 LAB — ABO/RH: ABO/RH(D): A POS

## 2017-08-15 ENCOUNTER — Encounter (HOSPITAL_COMMUNITY)
Admission: RE | Disposition: A | Discharge status: Home / Self Care | Payer: Self-pay | Source: Ambulatory Visit | Attending: Family Medicine

## 2017-08-15 ENCOUNTER — Inpatient Hospital Stay (HOSPITAL_COMMUNITY)
Admission: RE | Admit: 2017-08-15 | Discharge: 2017-08-18 | DRG: 765 | Disposition: A | Discharge status: Home / Self Care | Payer: Medicaid Other | Source: Ambulatory Visit | Attending: Family Medicine | Admitting: Family Medicine

## 2017-08-15 ENCOUNTER — Inpatient Hospital Stay (HOSPITAL_COMMUNITY): Payer: Medicaid Other | Admitting: Certified Registered Nurse Anesthetist

## 2017-08-15 ENCOUNTER — Encounter (HOSPITAL_COMMUNITY): Payer: Self-pay | Admitting: *Deleted

## 2017-08-15 DIAGNOSIS — O99824 Streptococcus B carrier state complicating childbirth: Secondary | ICD-10-CM | POA: Diagnosis present

## 2017-08-15 DIAGNOSIS — Z98891 History of uterine scar from previous surgery: Secondary | ICD-10-CM

## 2017-08-15 DIAGNOSIS — O99214 Obesity complicating childbirth: Secondary | ICD-10-CM | POA: Diagnosis present

## 2017-08-15 DIAGNOSIS — Z87891 Personal history of nicotine dependence: Secondary | ICD-10-CM | POA: Diagnosis not present

## 2017-08-15 DIAGNOSIS — Z3A39 39 weeks gestation of pregnancy: Secondary | ICD-10-CM

## 2017-08-15 DIAGNOSIS — O34211 Maternal care for low transverse scar from previous cesarean delivery: Principal | ICD-10-CM | POA: Diagnosis present

## 2017-08-15 DIAGNOSIS — Z6841 Body Mass Index (BMI) 40.0 and over, adult: Secondary | ICD-10-CM

## 2017-08-15 LAB — RPR: RPR Ser Ql: NONREACTIVE

## 2017-08-15 SURGERY — Surgical Case
Anesthesia: Spinal

## 2017-08-15 MED ORDER — OXYCODONE HCL 5 MG PO TABS
5.0000 mg | ORAL_TABLET | ORAL | Status: DC | PRN
  Administered 2017-08-16 – 2017-08-18 (×3): 5 mg via ORAL
  Filled 2017-08-15 (×3): qty 1

## 2017-08-15 MED ORDER — EPHEDRINE SULFATE 50 MG/ML IJ SOLN
INTRAMUSCULAR | Status: DC | PRN
  Administered 2017-08-15 (×2): 5 mg via INTRAVENOUS

## 2017-08-15 MED ORDER — LACTATED RINGERS IV SOLN
INTRAVENOUS | Status: DC
  Administered 2017-08-15: 11:00:00 via INTRAVENOUS
  Administered 2017-08-15: 125 mL via INTRAVENOUS

## 2017-08-15 MED ORDER — PHENYLEPHRINE 40 MCG/ML (10ML) SYRINGE FOR IV PUSH (FOR BLOOD PRESSURE SUPPORT)
PREFILLED_SYRINGE | INTRAVENOUS | Status: AC
  Filled 2017-08-15: qty 10

## 2017-08-15 MED ORDER — DIBUCAINE 1 % RE OINT: 1.0000 "application " | TOPICAL_OINTMENT | RECTAL | Status: DC | PRN

## 2017-08-15 MED ORDER — PHENYLEPHRINE HCL 10 MG/ML IJ SOLN
INTRAMUSCULAR | Status: DC | PRN
  Administered 2017-08-15: 120 ug via INTRAVENOUS
  Administered 2017-08-15: 80 ug via INTRAVENOUS

## 2017-08-15 MED ORDER — FENTANYL CITRATE (PF) 100 MCG/2ML IJ SOLN
INTRAMUSCULAR | Status: AC
  Filled 2017-08-15: qty 2

## 2017-08-15 MED ORDER — ACETAMINOPHEN 325 MG PO TABS: 650.0000 mg | ORAL_TABLET | ORAL | Status: DC | PRN

## 2017-08-15 MED ORDER — ENOXAPARIN SODIUM 80 MG/0.8ML ~~LOC~~ SOLN
80.0000 mg | SUBCUTANEOUS | Status: DC
  Administered 2017-08-16 – 2017-08-18 (×3): 80 mg via SUBCUTANEOUS
  Filled 2017-08-15 (×4): qty 0.8

## 2017-08-15 MED ORDER — FENTANYL CITRATE (PF) 100 MCG/2ML IJ SOLN
INTRAMUSCULAR | Status: DC | PRN
  Administered 2017-08-15: 10 ug via INTRATHECAL

## 2017-08-15 MED ORDER — OXYCODONE HCL 5 MG PO TABS: 5.0000 mg | ORAL_TABLET | Freq: Once | ORAL | Status: DC | PRN

## 2017-08-15 MED ORDER — DIPHENHYDRAMINE HCL 50 MG/ML IJ SOLN
12.5000 mg | Freq: Once | INTRAMUSCULAR | Status: AC
  Administered 2017-08-15: 12.5 mg via INTRAVENOUS

## 2017-08-15 MED ORDER — SENNOSIDES-DOCUSATE SODIUM 8.6-50 MG PO TABS
2.0000 | ORAL_TABLET | ORAL | Status: DC
  Administered 2017-08-16 – 2017-08-17 (×3): 2 via ORAL
  Filled 2017-08-15 (×3): qty 2

## 2017-08-15 MED ORDER — OXYTOCIN 40 UNITS IN LACTATED RINGERS INFUSION - SIMPLE MED: 2.5000 [IU]/h | INTRAVENOUS | Status: AC

## 2017-08-15 MED ORDER — SIMETHICONE 80 MG PO CHEW
80.0000 mg | CHEWABLE_TABLET | ORAL | Status: DC
  Administered 2017-08-16 – 2017-08-17 (×3): 80 mg via ORAL
  Filled 2017-08-15 (×3): qty 1

## 2017-08-15 MED ORDER — DEXTROSE 5 % IV SOLN
INTRAVENOUS | Status: AC
  Filled 2017-08-15: qty 3000

## 2017-08-15 MED ORDER — MORPHINE SULFATE (PF) 0.5 MG/ML IJ SOLN
INTRAMUSCULAR | Status: DC | PRN
  Administered 2017-08-15: .2 mg via INTRATHECAL

## 2017-08-15 MED ORDER — SCOPOLAMINE 1 MG/3DAYS TD PT72
MEDICATED_PATCH | TRANSDERMAL | Status: DC | PRN
  Administered 2017-08-15: 1 via TRANSDERMAL

## 2017-08-15 MED ORDER — ONDANSETRON HCL 4 MG/2ML IJ SOLN
INTRAMUSCULAR | Status: AC
  Filled 2017-08-15: qty 2

## 2017-08-15 MED ORDER — OXYCODONE HCL 5 MG PO TABS
10.0000 mg | ORAL_TABLET | ORAL | Status: DC | PRN
  Administered 2017-08-16 – 2017-08-17 (×3): 10 mg via ORAL
  Filled 2017-08-15 (×3): qty 2

## 2017-08-15 MED ORDER — OXYTOCIN 10 UNIT/ML IJ SOLN
INTRAMUSCULAR | Status: AC
  Filled 2017-08-15: qty 4

## 2017-08-15 MED ORDER — LACTATED RINGERS IV SOLN
INTRAVENOUS | Status: DC | PRN
  Administered 2017-08-15: 40 [IU] via INTRAVENOUS

## 2017-08-15 MED ORDER — HYDROMORPHONE HCL 1 MG/ML IJ SOLN
0.2500 mg | INTRAMUSCULAR | Status: DC | PRN
  Administered 2017-08-15 (×2): 0.25 mg via INTRAVENOUS
  Administered 2017-08-15: 0.5 mg via INTRAVENOUS

## 2017-08-15 MED ORDER — LACTATED RINGERS IV SOLN
INTRAVENOUS | Status: DC | PRN
  Administered 2017-08-15: 12:00:00 via INTRAVENOUS

## 2017-08-15 MED ORDER — BUPIVACAINE HCL (PF) 0.25 % IJ SOLN
INTRAMUSCULAR | Status: DC | PRN
  Administered 2017-08-15: 30 mL

## 2017-08-15 MED ORDER — SIMETHICONE 80 MG PO CHEW
80.0000 mg | CHEWABLE_TABLET | Freq: Three times a day (TID) | ORAL | Status: DC
  Administered 2017-08-15 – 2017-08-18 (×8): 80 mg via ORAL
  Filled 2017-08-15 (×8): qty 1

## 2017-08-15 MED ORDER — DIPHENHYDRAMINE HCL 25 MG PO CAPS
25.0000 mg | ORAL_CAPSULE | Freq: Four times a day (QID) | ORAL | Status: DC | PRN
  Administered 2017-08-15 – 2017-08-16 (×2): 25 mg via ORAL
  Filled 2017-08-15 (×2): qty 1

## 2017-08-15 MED ORDER — WITCH HAZEL-GLYCERIN EX PADS: 1.0000 "application " | MEDICATED_PAD | CUTANEOUS | Status: DC | PRN

## 2017-08-15 MED ORDER — PHENYLEPHRINE 8 MG IN D5W 100 ML (0.08MG/ML) PREMIX OPTIME
INJECTION | INTRAVENOUS | Status: AC
  Filled 2017-08-15: qty 100

## 2017-08-15 MED ORDER — MENTHOL 3 MG MT LOZG: 1.0000 | LOZENGE | OROMUCOSAL | Status: DC | PRN

## 2017-08-15 MED ORDER — MORPHINE SULFATE (PF) 0.5 MG/ML IJ SOLN
INTRAMUSCULAR | Status: AC
  Filled 2017-08-15: qty 10

## 2017-08-15 MED ORDER — SODIUM CHLORIDE 0.9 % IR SOLN
Status: DC | PRN
  Administered 2017-08-15: 1

## 2017-08-15 MED ORDER — DIPHENHYDRAMINE HCL 50 MG/ML IJ SOLN
INTRAMUSCULAR | Status: AC
  Filled 2017-08-15: qty 1

## 2017-08-15 MED ORDER — ACETAMINOPHEN 325 MG PO TABS
650.0000 mg | ORAL_TABLET | ORAL | Status: DC | PRN
  Administered 2017-08-15: 650 mg via ORAL
  Filled 2017-08-15: qty 2

## 2017-08-15 MED ORDER — ZOLPIDEM TARTRATE 5 MG PO TABS: 5.0000 mg | ORAL_TABLET | Freq: Every evening | ORAL | Status: DC | PRN

## 2017-08-15 MED ORDER — OXYCODONE HCL 5 MG/5ML PO SOLN: 5.0000 mg | Freq: Once | ORAL | Status: DC | PRN

## 2017-08-15 MED ORDER — BUPIVACAINE IN DEXTROSE 0.75-8.25 % IT SOLN
INTRATHECAL | Status: DC | PRN
  Administered 2017-08-15: 1.6 mL via INTRATHECAL

## 2017-08-15 MED ORDER — TETANUS-DIPHTH-ACELL PERTUSSIS 5-2.5-18.5 LF-MCG/0.5 IM SUSP: 0.5000 mL | Freq: Once | INTRAMUSCULAR | Status: DC

## 2017-08-15 MED ORDER — KETOROLAC TROMETHAMINE 30 MG/ML IJ SOLN
30.0000 mg | Freq: Four times a day (QID) | INTRAMUSCULAR | Status: DC
  Administered 2017-08-16: 30 mg via INTRAVENOUS
  Filled 2017-08-15: qty 1

## 2017-08-15 MED ORDER — COCONUT OIL OIL: 1.0000 "application " | TOPICAL_OIL | Status: DC | PRN

## 2017-08-15 MED ORDER — CEFAZOLIN SODIUM-DEXTROSE 2-4 GM/100ML-% IV SOLN
2.0000 g | INTRAVENOUS | Status: AC
  Administered 2017-08-15: 3 g via INTRAVENOUS

## 2017-08-15 MED ORDER — BUPIVACAINE IN DEXTROSE 0.75-8.25 % IT SOLN
INTRATHECAL | Status: AC
  Filled 2017-08-15: qty 2

## 2017-08-15 MED ORDER — HYDROMORPHONE HCL 1 MG/ML IJ SOLN
INTRAMUSCULAR | Status: AC
  Filled 2017-08-15: qty 1

## 2017-08-15 MED ORDER — PROMETHAZINE HCL 25 MG/ML IJ SOLN: 6.2500 mg | INTRAMUSCULAR | Status: DC | PRN

## 2017-08-15 MED ORDER — PRENATAL MULTIVITAMIN CH
1.0000 | ORAL_TABLET | Freq: Every day | ORAL | Status: DC
  Administered 2017-08-16 – 2017-08-18 (×3): 1 via ORAL
  Filled 2017-08-15 (×3): qty 1

## 2017-08-15 MED ORDER — PHENYLEPHRINE 8 MG IN D5W 100 ML (0.08MG/ML) PREMIX OPTIME
INJECTION | INTRAVENOUS | Status: DC | PRN
  Administered 2017-08-15: 60 ug/min via INTRAVENOUS

## 2017-08-15 MED ORDER — EPHEDRINE 5 MG/ML INJ
INTRAVENOUS | Status: AC
  Filled 2017-08-15: qty 10

## 2017-08-15 MED ORDER — SIMETHICONE 80 MG PO CHEW: 80.0000 mg | CHEWABLE_TABLET | ORAL | Status: DC | PRN

## 2017-08-15 MED ORDER — ONDANSETRON HCL 4 MG/2ML IJ SOLN
INTRAMUSCULAR | Status: DC | PRN
  Administered 2017-08-15: 4 mg via INTRAVENOUS

## 2017-08-15 MED ORDER — LACTATED RINGERS IV SOLN
INTRAVENOUS | Status: DC
  Administered 2017-08-15: 20:00:00 via INTRAVENOUS

## 2017-08-15 MED ORDER — SCOPOLAMINE 1 MG/3DAYS TD PT72
MEDICATED_PATCH | TRANSDERMAL | Status: AC
  Filled 2017-08-15: qty 1

## 2017-08-15 SURGICAL SUPPLY — 39 items
BENZOIN TINCTURE PRP APPL 2/3 (GAUZE/BANDAGES/DRESSINGS) ×3 IMPLANT
CHLORAPREP W/TINT 26ML (MISCELLANEOUS) ×3 IMPLANT
CLAMP CORD UMBIL (MISCELLANEOUS) IMPLANT
CLOSURE WOUND 1/2 X4 (GAUZE/BANDAGES/DRESSINGS) ×1
CLOTH BEACON ORANGE TIMEOUT ST (SAFETY) ×3 IMPLANT
DECANTER SPIKE VIAL GLASS SM (MISCELLANEOUS) ×3 IMPLANT
DRESSING DISP NPWT PICO 4X16 (MISCELLANEOUS) ×3 IMPLANT
DRSG OPSITE POSTOP 4X10 (GAUZE/BANDAGES/DRESSINGS) ×3 IMPLANT
ELECT REM PT RETURN 9FT ADLT (ELECTROSURGICAL) ×3
ELECTRODE REM PT RTRN 9FT ADLT (ELECTROSURGICAL) ×1 IMPLANT
EXTENDER TRAXI PANNICULUS (MISCELLANEOUS) ×3 IMPLANT
EXTRACTOR VACUUM M CUP 4 TUBE (SUCTIONS) ×2 IMPLANT
EXTRACTOR VACUUM M CUP 4' TUBE (SUCTIONS) ×1
GLOVE BIOGEL PI IND STRL 7.0 (GLOVE) ×2 IMPLANT
GLOVE BIOGEL PI INDICATOR 7.0 (GLOVE) ×4
GLOVE ECLIPSE 7.0 STRL STRAW (GLOVE) ×9 IMPLANT
GOWN STRL REUS W/TWL LRG LVL3 (GOWN DISPOSABLE) ×6 IMPLANT
HOVERMATT SINGLE USE (MISCELLANEOUS) ×3 IMPLANT
KIT ABG SYR 3ML LUER SLIP (SYRINGE) IMPLANT
NEEDLE HYPO 22GX1.5 SAFETY (NEEDLE) ×3 IMPLANT
NEEDLE HYPO 25X5/8 SAFETYGLIDE (NEEDLE) ×3 IMPLANT
NS IRRIG 1000ML POUR BTL (IV SOLUTION) ×3 IMPLANT
PACK C SECTION WH (CUSTOM PROCEDURE TRAY) ×3 IMPLANT
PAD ABD 7.5X8 STRL (GAUZE/BANDAGES/DRESSINGS) ×3 IMPLANT
PAD OB MATERNITY 4.3X12.25 (PERSONAL CARE ITEMS) ×3 IMPLANT
PENCIL SMOKE EVAC W/HOLSTER (ELECTROSURGICAL) ×3 IMPLANT
RTRCTR C-SECT PINK 25CM LRG (MISCELLANEOUS) ×3 IMPLANT
SPONGE GAUZE 4X4 12PLY STER LF (GAUZE/BANDAGES/DRESSINGS) ×3 IMPLANT
STRIP CLOSURE SKIN 1/2X4 (GAUZE/BANDAGES/DRESSINGS) ×2 IMPLANT
SUT PDS AB 0 CTX 60 (SUTURE) ×3 IMPLANT
SUT PLAIN 2 0 XLH (SUTURE) ×3 IMPLANT
SUT VIC AB 0 CTX 36 (SUTURE) ×6
SUT VIC AB 0 CTX36XBRD ANBCTRL (SUTURE) ×3 IMPLANT
SUT VIC AB 4-0 KS 27 (SUTURE) ×3 IMPLANT
SYR 30ML LL (SYRINGE) ×3 IMPLANT
SYR KIT LINE DRAW 1CC W/FILTR (LINER) ×3 IMPLANT
TOWEL OR 17X24 6PK STRL BLUE (TOWEL DISPOSABLE) ×3 IMPLANT
TRAXI PANNICULUS EXTENDER (MISCELLANEOUS) ×6
TRAY FOLEY BAG SILVER LF 14FR (SET/KITS/TRAYS/PACK) ×3 IMPLANT

## 2017-08-15 NOTE — Addendum Note (Signed)
Addendum  created 08/15/17 1607 by Graciela HusbandsFussell, Fonnie Crookshanks O, CRNA   Sign clinical note

## 2017-08-15 NOTE — Op Note (Addendum)
Preoperative Diagnosis:  IUP @ 8234w1d, elective repeat cesarean section  Postoperative Diagnosis:  Same  Procedure: Repeat low transverse cesarean section  Surgeon: Tinnie Gensanya Zenith Lamphier, M.D. and Caryl AdaJazma Phelps, DO  Assistant: None  Findings: Viable female infant, APGAR (1 MIN):  7 APGAR (5 MINS):  8 Weight 3255g, cephalic presentation, OA position  Estimated blood loss: 900 cc  Complications: None known  Specimens: Placenta to labor and delivery  Reason for procedure: Briefly, the patient is a 27 y.o. [id number] 4134w1d who presents for elective repeat c-section.  Procedure: Patient is a to the OR where spinal analgesia was administered. She was then placed in a supine position with left lateral tilt. She received 3g of Ancef and SCDs were in place. A timeout was performed. She was prepped and draped in the usual sterile fashion. A Foley catheter was placed in the bladder. Pannus was retracted. A knife was then used to make a Pfannenstiel incision over old scar. This incision was carried out to underlying fascia which was divided in the midline with the knife. The incision was extended laterally, sharply.  The rectus was divided in the midline and a modified Maylard completed due to moderate adhesions. The peritoneal cavity was entered bluntly.  Alexis retractor was placed inside the incision.  Bladder flap made. A knife was used to make a low transverse incision on the uterus. This incision was carried down to the amniotic cavity was entered. Fetus was in cephalic, OA position and was brought up out of the incision with vacuum assistance. Cord was clamped x 2 and cut. Infant taken to waiting nurse. Cord blood was obtained. Placenta was delivered from the uterus.  Uterus was cleaned with dry lap pads. Uterine incision closed with 0 Vicryl suture in a locked running fashion. A figure of eight stitch used to achieve hemostasis. Alexis retractor was removed from the abdomen. Peritoneal closure was done with 0  Vicryl suture.  Fascia is closed with 0 PDS suture in a running fashion. Subcutaneous tissue infused with 30cc 0.25% Marcaine.  Subcutaneous closure was performed with 0 plain suture.  Skin closed using 3-0 Vicryl on a Keith needle.  Steri strips applied, followed by PICO dressing.  All instrument, needle and lap counts were correct x 2.  Patient was awake and taken to PACU stable.  Infant remained with mom in couplet care , stable.   Caryl AdaJazma Phelps, DO OB Fellow Faculty Practice, [facility] 08/15/2017, 12:48 PM

## 2017-08-15 NOTE — Transfer of Care (Signed)
Immediate Anesthesia Transfer of Care Note  Patient: Denise Harmon  Procedure(s) Performed: Procedure(s): REPEAT CESAREAN SECTION (N/A)  Patient Location: PACU  Anesthesia Type:Spinal  Level of Consciousness: awake, alert , oriented and patient cooperative  Airway & Oxygen Therapy: Patient Spontanous Breathing  Post-op Assessment: Report given to RN and Post -op Vital signs reviewed and stable  Post vital signs: Reviewed and stable  Last Vitals:  Vitals:   08/15/17 0818  BP: (!) 121/51  Pulse: (!) 101  Resp: 20  Temp: 36.6 C    Last Pain:  Vitals:   08/15/17 0818  TempSrc: Oral  PainSc: 0-No pain         Complications: No apparent anesthesia complications

## 2017-08-15 NOTE — Lactation Note (Signed)
This note was copied from a baby's chart. Lactation Consultation Note  Patient Name: Denise Harmon's Date: 08/15/2017 Reason for consult: Initial assessment  Baby 4 hours old. Mom reports that she has been hand expressing drops of colostrum into baby's mouth, but intends to give formula primarily. Enc mom to call for assistance as needed.   Maternal Data Has patient been taught Hand Expression?: Yes  Feeding Feeding Type: Bottle Fed - Formula Nipple Type: Slow - flow Length of feed: 8 min  LATCH Score Latch: Repeated attempts needed to sustain latch, nipple held in mouth throughout feeding, stimulation needed to elicit sucking reflex. (sucking,licking)  Audible Swallowing: A few with stimulation  Type of Nipple: Flat  Comfort (Breast/Nipple): Soft / non-tender  Hold (Positioning): Assistance needed to correctly position infant at breast and maintain latch.  LATCH Score: 6  Interventions    Lactation Tools Discussed/Used     Consult Status Consult Status: PRN    Sherlyn HayJennifer D Rhyker Silversmith 08/15/2017, 4:02 PM

## 2017-08-15 NOTE — Interval H&P Note (Signed)
History and Physical Interval Note:  08/15/2017 9:38 AM  Denise Harmon  has presented today for surgery, with the diagnosis of RCS  The various methods of treatment have been discussed with the patient and family. After consideration of risks, benefits and other options for treatment, the patient has consented to  Procedure(s): REPEAT CESAREAN SECTION (N/A) as a surgical intervention .  The patient's history has been reviewed, patient examined, no change in status, stable for surgery.  I have reviewed the patient's chart and labs.  Questions were answered to the patient's satisfaction.     Reva Boresanya S Louis Gaw

## 2017-08-15 NOTE — Anesthesia Procedure Notes (Signed)
Spinal  Patient location during procedure: OR Start time: 08/15/2017 10:55 AM End time: 08/15/2017 11:00 AM Staffing Anesthesiologist: Anitra LauthMILLER, WARREN RAY Performed: anesthesiologist  Preanesthetic Checklist Completed: patient identified, surgical consent, pre-op evaluation, timeout performed, IV checked, risks and benefits discussed and monitors and equipment checked Spinal Block Patient position: sitting Prep: Betadine Patient monitoring: heart rate, continuous pulse ox and blood pressure Approach: midline Location: L3-4 Injection technique: single-shot Needle Needle type: Pencan  Needle gauge: 24 G Needle length: 10 cm Assessment Sensory level: T4

## 2017-08-15 NOTE — Anesthesia Postprocedure Evaluation (Signed)
Anesthesia Post Note  Patient: Denise Harmon  Procedure(s) Performed: Procedure(s) (LRB): REPEAT CESAREAN SECTION (N/A)     Patient location during evaluation: PACU Anesthesia Type: Spinal Level of consciousness: oriented and awake and alert Pain management: pain level controlled Vital Signs Assessment: post-procedure vital signs reviewed and stable Respiratory status: spontaneous breathing and respiratory function stable Cardiovascular status: blood pressure returned to baseline and stable Postop Assessment: no headache and no backache Anesthetic complications: no    Last Vitals:  Vitals:   08/15/17 1345 08/15/17 1413  BP: (!) 107/52 (!) 106/55  Pulse: 69 65  Resp: 16 18  Temp:  37.1 C  SpO2: 100% 98%    Last Pain:  Vitals:   08/15/17 1413  TempSrc: Oral  PainSc: 4    Pain Goal:                 [name]

## 2017-08-15 NOTE — Anesthesia Preprocedure Evaluation (Signed)
Anesthesia Evaluation  Patient identified by MRN, date of birth, ID band Patient awake    Reviewed: Allergy & Precautions, NPO status , Patient's Chart, lab work & pertinent test results  Airway Mallampati: III  TM Distance: >3 FB Neck ROM: Full    Dental no notable dental hx.    Pulmonary neg pulmonary ROS, asthma , former smoker,    Pulmonary exam normal breath sounds clear to auscultation       Cardiovascular negative cardio ROS Normal cardiovascular exam Rhythm:Regular Rate:Normal     Neuro/Psych Anxiety Depression negative neurological ROS  negative psych ROS   GI/Hepatic negative GI ROS, Neg liver ROS,   Endo/Other  negative endocrine ROSMorbid obesity  Renal/GU negative Renal ROS  negative genitourinary   Musculoskeletal negative musculoskeletal ROS (+)   Abdominal   Peds negative pediatric ROS (+)  Hematology negative hematology ROS (+)   Anesthesia Other Findings   Reproductive/Obstetrics negative OB ROS (+) Pregnancy                             Anesthesia Physical Anesthesia Plan  ASA: III  Anesthesia Plan: Spinal   Post-op Pain Management:    Induction:   PONV Risk Score and Plan: 2 and Ondansetron and Treatment may vary due to age or medical condition  Airway Management Planned: Natural Airway  Additional Equipment:   Intra-op Plan:   Post-operative Plan:   Informed Consent:   Plan Discussed with:   Anesthesia Plan Comments:         Anesthesia Quick Evaluation

## 2017-08-15 NOTE — Anesthesia Postprocedure Evaluation (Signed)
Anesthesia Post Note  Patient: Denise CassCristela Nicole Harmon  Procedure(s) Performed: Procedure(s) (LRB): REPEAT CESAREAN SECTION (N/A)     Patient location during evaluation: Mother Baby Anesthesia Type: Spinal Level of consciousness: awake and alert and oriented Pain management: satisfactory to patient Vital Signs Assessment: post-procedure vital signs reviewed and stable Respiratory status: respiratory function stable and spontaneous breathing Cardiovascular status: blood pressure returned to baseline Postop Assessment: no headache, no backache, spinal receding, patient able to bend at knees and adequate PO intake Anesthetic complications: no    Last Vitals:  Vitals:   08/15/17 1413 08/15/17 1540  BP: (!) 106/55 (!) 110/47  Pulse: 65 82  Resp: 18 17  Temp: 37.1 C 36.9 C  SpO2: 98% 98%    Last Pain:  Vitals:   08/15/17 1550  TempSrc:   PainSc: 4    Pain Goal:                 Mostafa Yuan

## 2017-08-16 LAB — CBC
HCT: 28.3 % — ABNORMAL LOW (ref 36.0–46.0)
Hemoglobin: 9.4 g/dL — ABNORMAL LOW (ref 12.0–15.0)
MCH: 25.5 pg — AB (ref 26.0–34.0)
MCHC: 33.2 g/dL (ref 30.0–36.0)
MCV: 76.9 fL — AB (ref 78.0–100.0)
PLATELETS: 263 10*3/uL (ref 150–400)
RBC: 3.68 MIL/uL — AB (ref 3.87–5.11)
RDW: 16.8 % — ABNORMAL HIGH (ref 11.5–15.5)
WBC: 8.8 10*3/uL (ref 4.0–10.5)

## 2017-08-16 MED ORDER — IBUPROFEN 600 MG PO TABS
600.0000 mg | ORAL_TABLET | Freq: Four times a day (QID) | ORAL | Status: DC | PRN
  Administered 2017-08-16 – 2017-08-18 (×6): 600 mg via ORAL
  Filled 2017-08-16 (×5): qty 1

## 2017-08-16 NOTE — Progress Notes (Signed)
Two older children have been in the room and strapped in strollers since this RN took report at 1500.  This RN gave FOB the option to bring the 2 older children downstairs to the outside eating area so that the children could get out of the strollers.  This suggestion was not acknowledged by either parent.

## 2017-08-16 NOTE — Progress Notes (Signed)
Post Partum Day #1 Subjective: no complaints, up ad lib and tolerating PO  Objective: Blood pressure (!) 113/41, pulse 95, temperature 99.1 F (37.3 C), temperature source Axillary, resp. rate 20, height 5\' 5"  (1.651 m), weight (!) 153.8 kg (339 lb), last menstrual period 11/15/2016, SpO2 94 %, unknown if currently breastfeeding.  Physical Exam:  General: alert Lochia: appropriate Uterine Fundus: firm Dressing: c/d/i/PICO functioning DVT Evaluation: No evidence of DVT seen on physical exam.   Recent Labs  08/14/17 1010 08/16/17 0520  HGB 11.2* 9.4*  HCT 35.1* 28.3*    Assessment/Plan: Encourage ambulation   LOS: 1 day   Denise Harmon C Denise Harmon 08/16/2017, 6:36 AM

## 2017-08-16 NOTE — Clinical Social Work Maternal (Signed)
CLINICAL SOCIAL WORK MATERNAL/CHILD NOTE  Patient Details  Name: Denise Harmon MRN: [id number] Date of Birth: 01/14/1990  Date:  08/16/2017  Clinical Social Worker Initiating Note:  Leonell Lobdell, LCSW Date/ Time Initiated:  08/16/17/1215     Child's Name:  Denise Harmon   Legal Guardian:  Other (Comment) (Parents: [name] and Benjamin Harmon)   Need for Interpreter:  None   Date of Referral:  08/16/17     Reason for Referral:  Other (Comment) (Anx, Dep, PTSD, Edinburgh score of 14)   Referral Source:  Central Nursery   Address:  [address], [address], [place], [place] [address]  Phone number:  [phone]   Household Members:  Friends, Minor Children, Significant Other (MOB lives with FOB/Fiance, their 2 children, and a couple with an 11 year old daughter.)   Natural Supports (not living in the home):  Parent (MOB reports that her parents are supportive, but living in [place].  MOB's 5 year old son lives with her parents, currently, until they get settled into their own place. )   Professional Supports: None (Parents are open to community resource referrals.)   Employment:     Type of Work: FOB works at [employer]   Education:      Financial Resources:  Medicaid   Other Resources:  Food Stamps , WIC   Cultural/Religious Considerations Which May Impact Care: None stated  Strengths:  Ability to meet basic needs , Pediatrician chosen , Home prepared for child  ([facility])   Risk Factors/Current Problems:  Transportation , Mental Health Concerns    Cognitive State:  Alert , Goal Oriented , Insightful    Mood/Affect:  Calm , Relaxed , Interested    CSW Assessment: CSW met with parents in MOB's first floor room/145 to offer support and complete assessment due to hx of Anx/Dep, PTSD, and score of 14 on Edinburgh Postpartum Depression Screen.  Parents were pleasant and welcoming, however, it was extremely difficult to complete assessment as  their two young children were in the room making noise and needing constant supervision.  Parents insisted this was a good time to talk with them.   Parents report that they have been in [place] for approximately 6 months.  They moved here from [place] for a lower cost of living.  FOB had friends who moved out here and offered for them to join them.  They are currently staying with FOB's friends until they have saved enough money to get their own place.  FOB reports that they do not have a time limit on this and have been invited to stay with these friends for as long as needed, but feel motivated to get their own space as soon as they are able.  MOB states desire for her 5 year old son to move out here with them once they are settled, but states her parents are supportive and allowing him to stay in [place] for the time being.  Parents report that the have been together approximately 3 years and describe their relationship as positive.  MOB introduced FOB as her fiance.   MOB reports that she is feeling overwhelmed with having another baby and reports that their son, Denise Harmon, will be 25 next month, and their daughter, Denise Harmon, will be 25 next month.  CSW validated her feelings and discussed SIDS, birth control, community resources, PMADs and made recommendations that MOB enroll in Medicaid transportation and for parents to participate in Healthy Start services to increase positive parenting skills.    CSW notes that MOB is at heightened risk for PMADs given hx of Anx/Dep, Edinburgh Score of 14 after delivery, and life transitions.  Parents were attentive to information given and agree to notify a medical professional if they have concerns at any time.  MOB states she does not feel she needs counseling or medication to treat her Anxiety and Depression and that a recommendation was made in the past to start Zoloft, but she declined.  She admits to feeling suicidal in the past, but states that her children are  her motivation to keep living and denies any current SI.  She was able to identify positive coping mechanisms as cooking and spending time with her family.  Parents were receptive to recommendations to participate with Healthy Start and to monitor mental health needs through use of the Edinburgh and the New Mom Checklist from Postpartum Progress.  CSW also provided MOB with information about support groups held at [facility].  CSW informed parents of mental health follow up available at [facility] if they feel treatment is needed at any time.    Parents seemed appreciative.   Parents report that they have all basic necessities for baby at home and that she will sleep in a bassinet.  They state awareness of SIDS precautions as reviewed by CSW.     CSW had concern that family may not have resources for all to eat while MOB is hospitalized.  CSW asked if they have had enough food and FOB initially said yes, but MOB states that they can't afford the $6 for the extra tray.  CSW offered 2 $6 meal tickets and parents accepted and were appreciative.    CSW Plan/Description:  Information/Referral to Community Resources , No Further Intervention Required/No Barriers to Discharge, Patient/Family Education     Tyheem Boughner Elizabeth, LCSW 08/16/2017, 2:22 PM 

## 2017-08-17 NOTE — Progress Notes (Signed)
Post Op Day 2 Subjective: up ad lib, voiding, tolerating PO and + flatus  Objective: Blood pressure (!) 126/51, pulse 76, temperature 99.3 F (37.4 C), temperature source Oral, resp. rate 18, height 5\' 5"  (1.651 m), weight (!) 153.8 kg (339 lb), last menstrual period 11/15/2016, SpO2 94 %, unknown if currently breastfeeding.  Physical Exam:  General: alert, cooperative and morbidly obese Lochia: appropriate Uterine Fundus: firm Incision: healing well DVT Evaluation: No evidence of DVT seen on physical exam. Negative Homan's sign.   Recent Labs  08/14/17 1010 08/16/17 0520  HGB 11.2* 9.4*  HCT 35.1* 28.3*    Assessment/Plan: Plan for discharge tomorrow   LOS: 2 days   Tillman Sers 08/17/2017, 7:30 AM   CNM attestation Post Partum Day #1 I have seen and examined this patient and agree with above documentation in the resident's note.   Denise Harmon is a 27 y.o. [id number] s/p rLTCS.  Pt denies problems with ambulating, voiding or po intake. Pain is well controlled.  Plan for birth control is Nexplanon.  Method of Feeding: bottle  PE:  BP (!) 126/51 (BP Location: Left Arm)   Pulse 76   Temp 99.3 F (37.4 C) (Oral)   Resp 18   Ht 5\' 5"  (1.651 m)   Wt (!) 153.8 kg (339 lb)   LMP 11/15/2016   SpO2 94%   Breastfeeding? Unknown   BMI 56.41 kg/m  Fundus firm  Plan for discharge: 08/18/17 (declines early d/c)  Cam Hai, CNM 9:04 AM 08/17/2017

## 2017-08-18 MED ORDER — SENNOSIDES-DOCUSATE SODIUM 8.6-50 MG PO TABS: 2.0000 | ORAL_TABLET | Freq: Every day | ORAL | 0 refills | Status: AC

## 2017-08-18 MED ORDER — FERROUS SULFATE 325 (65 FE) MG PO TABS
325.0000 mg | ORAL_TABLET | Freq: Every day | ORAL | Status: DC
  Administered 2017-08-18: 325 mg via ORAL
  Filled 2017-08-18: qty 1

## 2017-08-18 MED ORDER — FERROUS SULFATE 325 (65 FE) MG PO TABS: 325.0000 mg | ORAL_TABLET | Freq: Every day | ORAL | 0 refills | Status: AC

## 2017-08-18 MED ORDER — OXYCODONE HCL 5 MG PO TABS: 5.0000 mg | ORAL_TABLET | ORAL | 0 refills | Status: AC | PRN

## 2017-08-18 NOTE — Discharge Summary (Signed)
Obstetric Discharge Summary Reason for Admission: cesarean section Prenatal Procedures: none Intrapartum Procedures: cesarean: low cervical, transverse Postpartum Procedures: none Complications-Operative and Postpartum: none Hemoglobin  Date Value Ref Range Status  08/16/2017 9.4 (L) 12.0 - 15.0 g/dL Final  88/50/2774 12.8 11.1 - 15.9 g/dL Final   HCT  Date Value Ref Range Status  08/16/2017 28.3 (L) 36.0 - 46.0 % Final   Hematocrit  Date Value Ref Range Status  06/10/2017 35.7 34.0 - 46.6 % Final    Physical Exam:  General: alert, cooperative and morbidly obese Lochia: appropriate Uterine Fundus: firm Incision: healing well DVT Evaluation: No evidence of DVT seen on physical exam.  Discharge Diagnoses: Term Pregnancy-delivered  Discharge Information: Date: 08/18/2017 Activity: pelvic rest Diet: routine Medications: oxy IR Condition: stable Instructions: refer to practice specific booklet Discharge to: home Follow-up Information    [facility] Schedule an appointment as soon as possible for a visit in 4 week(s).   Specialty:  Obstetrics and Gynecology Contact information: [address], [address] [place] [address] [phone]          Newborn Data: Live born female  Birth Weight: 7 lb 2.8 oz (3255 g) APGAR: 7, 8  Patient desires OCP's for birth control.   Home with mother.  Tillman Sers 08/18/2017, 6:24 AM   I confirm that I have verified the information documented in the resident's note and that I have also personally reperformed the physical exam and all medical decision making activities.  Luna Kitchens CNM

## 2017-08-18 NOTE — Discharge Instructions (Signed)

## 2017-09-16 ENCOUNTER — Ambulatory Visit: Payer: Medicaid Other | Admitting: Obstetrics and Gynecology

## 2017-09-18 ENCOUNTER — Inpatient Hospital Stay (HOSPITAL_COMMUNITY)
Admission: AD | Admit: 2017-09-18 | Discharge: 2017-09-18 | Disposition: A | Discharge status: Home / Self Care | Payer: Medicaid Other | Source: Ambulatory Visit | Attending: Obstetrics & Gynecology | Admitting: Obstetrics & Gynecology

## 2017-09-18 ENCOUNTER — Encounter (HOSPITAL_COMMUNITY): Payer: Self-pay | Admitting: *Deleted

## 2017-09-18 DIAGNOSIS — F4310 Post-traumatic stress disorder, unspecified: Secondary | ICD-10-CM | POA: Diagnosis not present

## 2017-09-18 DIAGNOSIS — O900 Disruption of cesarean delivery wound: Secondary | ICD-10-CM | POA: Diagnosis present

## 2017-09-18 DIAGNOSIS — L03311 Cellulitis of abdominal wall: Secondary | ICD-10-CM

## 2017-09-18 DIAGNOSIS — Z823 Family history of stroke: Secondary | ICD-10-CM | POA: Insufficient documentation

## 2017-09-18 DIAGNOSIS — Z888 Allergy status to other drugs, medicaments and biological substances status: Secondary | ICD-10-CM | POA: Diagnosis not present

## 2017-09-18 DIAGNOSIS — Z800 Family history of malignant neoplasm of digestive organs: Secondary | ICD-10-CM | POA: Insufficient documentation

## 2017-09-18 DIAGNOSIS — Z79899 Other long term (current) drug therapy: Secondary | ICD-10-CM | POA: Insufficient documentation

## 2017-09-18 DIAGNOSIS — Z79891 Long term (current) use of opiate analgesic: Secondary | ICD-10-CM | POA: Insufficient documentation

## 2017-09-18 DIAGNOSIS — Z8249 Family history of ischemic heart disease and other diseases of the circulatory system: Secondary | ICD-10-CM | POA: Diagnosis not present

## 2017-09-18 DIAGNOSIS — F329 Major depressive disorder, single episode, unspecified: Secondary | ICD-10-CM | POA: Diagnosis not present

## 2017-09-18 DIAGNOSIS — R109 Unspecified abdominal pain: Secondary | ICD-10-CM | POA: Diagnosis not present

## 2017-09-18 DIAGNOSIS — Z833 Family history of diabetes mellitus: Secondary | ICD-10-CM | POA: Insufficient documentation

## 2017-09-18 DIAGNOSIS — Z98891 History of uterine scar from previous surgery: Secondary | ICD-10-CM

## 2017-09-18 DIAGNOSIS — Z825 Family history of asthma and other chronic lower respiratory diseases: Secondary | ICD-10-CM | POA: Insufficient documentation

## 2017-09-18 DIAGNOSIS — Z87891 Personal history of nicotine dependence: Secondary | ICD-10-CM | POA: Insufficient documentation

## 2017-09-18 DIAGNOSIS — Z8051 Family history of malignant neoplasm of kidney: Secondary | ICD-10-CM | POA: Insufficient documentation

## 2017-09-18 DIAGNOSIS — T8131XA Disruption of external operation (surgical) wound, not elsewhere classified, initial encounter: Secondary | ICD-10-CM

## 2017-09-18 MED ORDER — SULFAMETHOXAZOLE-TRIMETHOPRIM 800-160 MG PO TABS: 1.0000 | ORAL_TABLET | Freq: Two times a day (BID) | ORAL | 0 refills | Status: DC

## 2017-09-18 MED ORDER — SULFAMETHOXAZOLE-TRIMETHOPRIM 800-160 MG PO TABS: 1.0000 | ORAL_TABLET | Freq: Two times a day (BID) | ORAL | 0 refills | Status: AC

## 2017-09-18 NOTE — MAU Note (Signed)
Urine in lab 

## 2017-09-18 NOTE — Discharge Instructions (Signed)

## 2017-09-18 NOTE — MAU Note (Signed)
Pt presents with c/o fluid collection in right side of abdomen above cesarean section incision.  Pt also reports feeling 2 "hard lumps" on the right side of abdomen too.

## 2017-09-18 NOTE — MAU Provider Note (Signed)
Chief Complaint:  Abdominal Pain   First Provider Initiated Contact with Patient 09/18/17 1451       HPI: Denise Harmon is a 27 y.o. [id number] who presents to maternity admissions reporting fluid accumulating on middle of abdomen then draining yesterday.  Has warmth and redness over lower abdomen above incision and below umbilicus.  Has not had much pain. She reports vaginal bleeding, vaginal itching/burning, urinary symptoms, h/a, dizziness, n/v, or fever/chills.    Abdominal Pain  This is a new problem. The current episode started 1 to 4 weeks ago. The onset quality is gradual. The problem occurs intermittently. The problem has been unchanged. The pain is located in the generalized abdominal region. The pain is mild. The quality of the pain is burning (Pain is nonspecific, area on skin feels warm and tender). The abdominal pain does not radiate. Pertinent negatives include no constipation, diarrhea, dysuria, fever, myalgias, nausea or vomiting. The pain is aggravated by palpation. The pain is relieved by nothing. She has tried nothing for the symptoms.    RN note: Pt presents with c/o fluid collection in right side of abdomen above cesarean section incision.  Pt also reports feeling 2 "hard lumps" on the right side of abdomen too.  Past Medical History: Past Medical History:  Diagnosis Date  . Anxiety   . Depression   . PTSD (post-traumatic stress disorder)     Past obstetric history: OB History  Gravida Para Term Preterm AB Living  SAB TAB Ectopic Multiple Live Births  1     0 4    # Outcome Date GA Lbr Len/2nd Weight Sex Delivery Anes PTL Lv  5 Term 08/15/17 [redacted]w[redacted]d  7 lb 2.8 oz (3.255 kg) F CS-Vac Spinal  LIV     Birth Comments: wnl  4 Term 08/2016 [redacted]w[redacted]d    CS-LTranv   LIV  3 Term 2016 [redacted]w[redacted]d    CS-LTranv   LIV  2 Term 2013 [redacted]w[redacted]d    CS-LTranv   LIV     Complications: Failure to Progress in First Stage     Birth Comments: had infected incision  1 SAB 2012               Past Surgical History: Past Surgical History:  Procedure Laterality Date  . CESAREAN SECTION    . CESAREAN SECTION N/A 08/15/2017   Procedure: REPEAT CESAREAN SECTION;  Surgeon: Reva Bores, MD;  Location: [facility] BIRTHING SUITES;  Service: Obstetrics;  Laterality: N/A;  . MULTIPLE TOOTH EXTRACTIONS    . SHOULDER SURGERY      Family History: Family History  Problem Relation Age of Onset  . Diabetes Mellitus II Mother   . Diabetes Mellitus II Father   . Hypertension Father   . Asthma Sister   . Colon cancer Maternal Grandmother   . Cancer Maternal Grandfather   . Kidney disease Maternal Grandfather   . Heart disease Maternal Grandfather   . Stroke Maternal Grandfather     Social History: Social History  Substance Use Topics  . Smoking status: Former Smoker    Quit date: 12/09/2016  . Smokeless tobacco: Former Neurosurgeon  . Alcohol use No    Allergies:  Allergies  Allergen Reactions  . Naproxen Hives    Meds:  Prescriptions Prior to Admission  Medication Sig Dispense Refill Last Dose  . ferrous sulfate 325 (65 FE) MG tablet Take 1 tablet (325 mg total) by mouth daily with breakfast.  30 tablet 0   . HYDROCORTISONE EX Apply 1 application topically 2 (two) times daily as needed (rash).   Unknown at Unknown time  . oxyCODONE (OXY IR/ROXICODONE) 5 MG immediate release tablet Take 1 tablet (5 mg total) by mouth every 4 (four) hours as needed (pain scale 4-7). 30 tablet 0   . Prenatal Vit-Fe Fumarate-FA (PRENATAL PO) Take 1 tablet by mouth daily.   More than a month at Unknown time  . senna-docusate (SENOKOT-S) 8.6-50 MG tablet Take 2 tablets by mouth at bedtime. 30 tablet 0   . Vitamin D, Ergocalciferol, (DRISDOL) 50000 units CAPS capsule Take 1 capsule (50,000 Units total) by mouth every 7 (seven) days. 30 capsule 2 Past Week at Unknown time    I have reviewed patient's Past Medical Hx, Surgical Hx, Family Hx, Social Hx, medications and allergies.  ROS:  Review of  Systems  Constitutional: Negative for fever.  Gastrointestinal: Positive for abdominal pain. Negative for constipation, diarrhea, nausea and vomiting.  Genitourinary: Negative for dysuria.  Musculoskeletal: Negative for myalgias.   Other systems negative     Physical Exam  Patient Vitals for the past 24 hrs:  BP Temp Temp src Pulse Resp SpO2  09/18/17 1440 112/69 98.1 F (36.7 C) Oral (!) 110 18 97 %   Constitutional: Well-developed, well-nourished female in no acute distress.  Cardiovascular: normal rate and rhythm, no ectopy audible, S1 & S2 heard, no murmur Respiratory: normal effort, no distress. Lungs CTAB with no wheezes or crackles GI: Abd soft, non-tender.  Nondistended.  No rebound, No guarding.  Bowel Sounds audible There is a pinpoint area of superficial dehiscence at right end of incision, won't admit swab.  Not draining.   MS: Extremities nontender, no edema, normal ROM Neurologic: Alert and oriented x 4.   Grossly nonfocal. GU: Neg CVAT. Skin:  Warm and Dry Psych:  Affect appropriate.    On lower abdomen there is a piel d'orange appearance with a pale pink discoloration below umbilicus, moreso on right.  There are two healed lesions which were where a large amount of liquid drained a few days ago.     PELVIC EXAM:  Deferred   Labs: No results found for this or any previous visit (from the past 24 hour(s)). --/--/A POS, A POS (08/15 1010)  Imaging:  No results found.  MAU Course/MDM: I have ordered labs as follows: none Imaging ordered: none Results reviewed.   Consult Dr Macon Large with presentation and exam findings  She recommends Septra DS x 10 days.   Treatments in MAU included none.   Pt stable at time of discharge.  Assessment: Postop x 1 month Cellulitis, abdominal wall - Plan: Discharge patient  Status post repeat low transverse cesarean section  Postoperative wound dehiscence, initial encounter - Tiny pinpoint area on right of incision, not  draining - Plan: Discharge patient   Plan: Discharge home Recommend Keep area clean and dry Rx sent for Septra DS  for cellulitis Message sent to clinic to reschedule PP appt  Encouraged to return here or to other Urgent Care/ED if she develops worsening of symptoms, increase in pain, fever, or other concerning symptoms.   Wynelle Bourgeois CNM, MSN Certified Nurse-Midwife 09/18/2017 2:51 PM

## 2017-09-24 ENCOUNTER — Telehealth: Payer: Self-pay | Admitting: *Deleted

## 2017-09-24 NOTE — Telephone Encounter (Signed)
Called patient to schedule appt.and voicemail picked up and was unable to leave a message.Denise KitchenMarland Harmon

## 2017-12-22 IMAGING — CR DG CHEST 2V
2 series · 2 of 2 positions shown · non-contrast
Comparison: Prior CT from 01/14/2017.

CLINICAL DATA: Initial evaluation for acute shortness of breath.

EXAM:
CHEST  2 VIEW

[w chest pa]
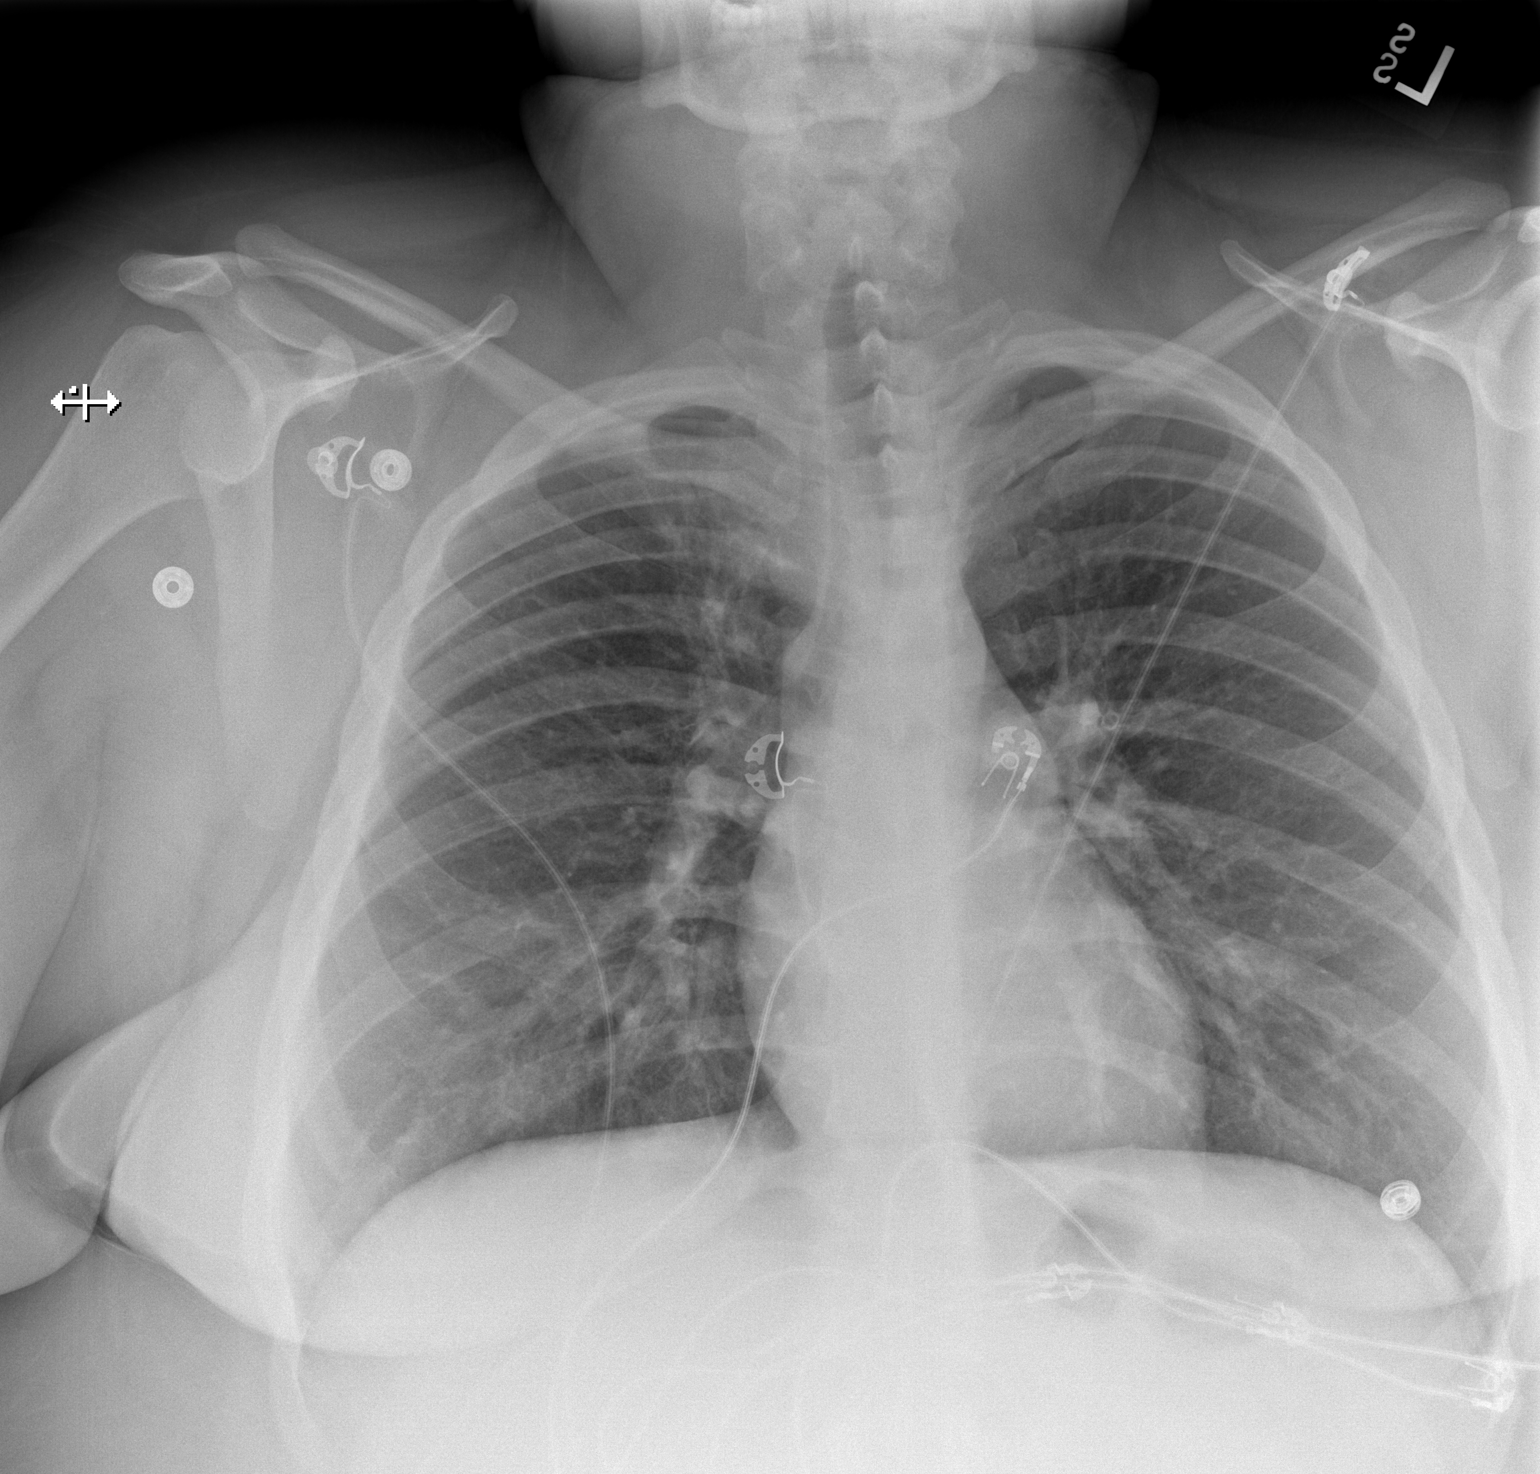

[w chest lat]
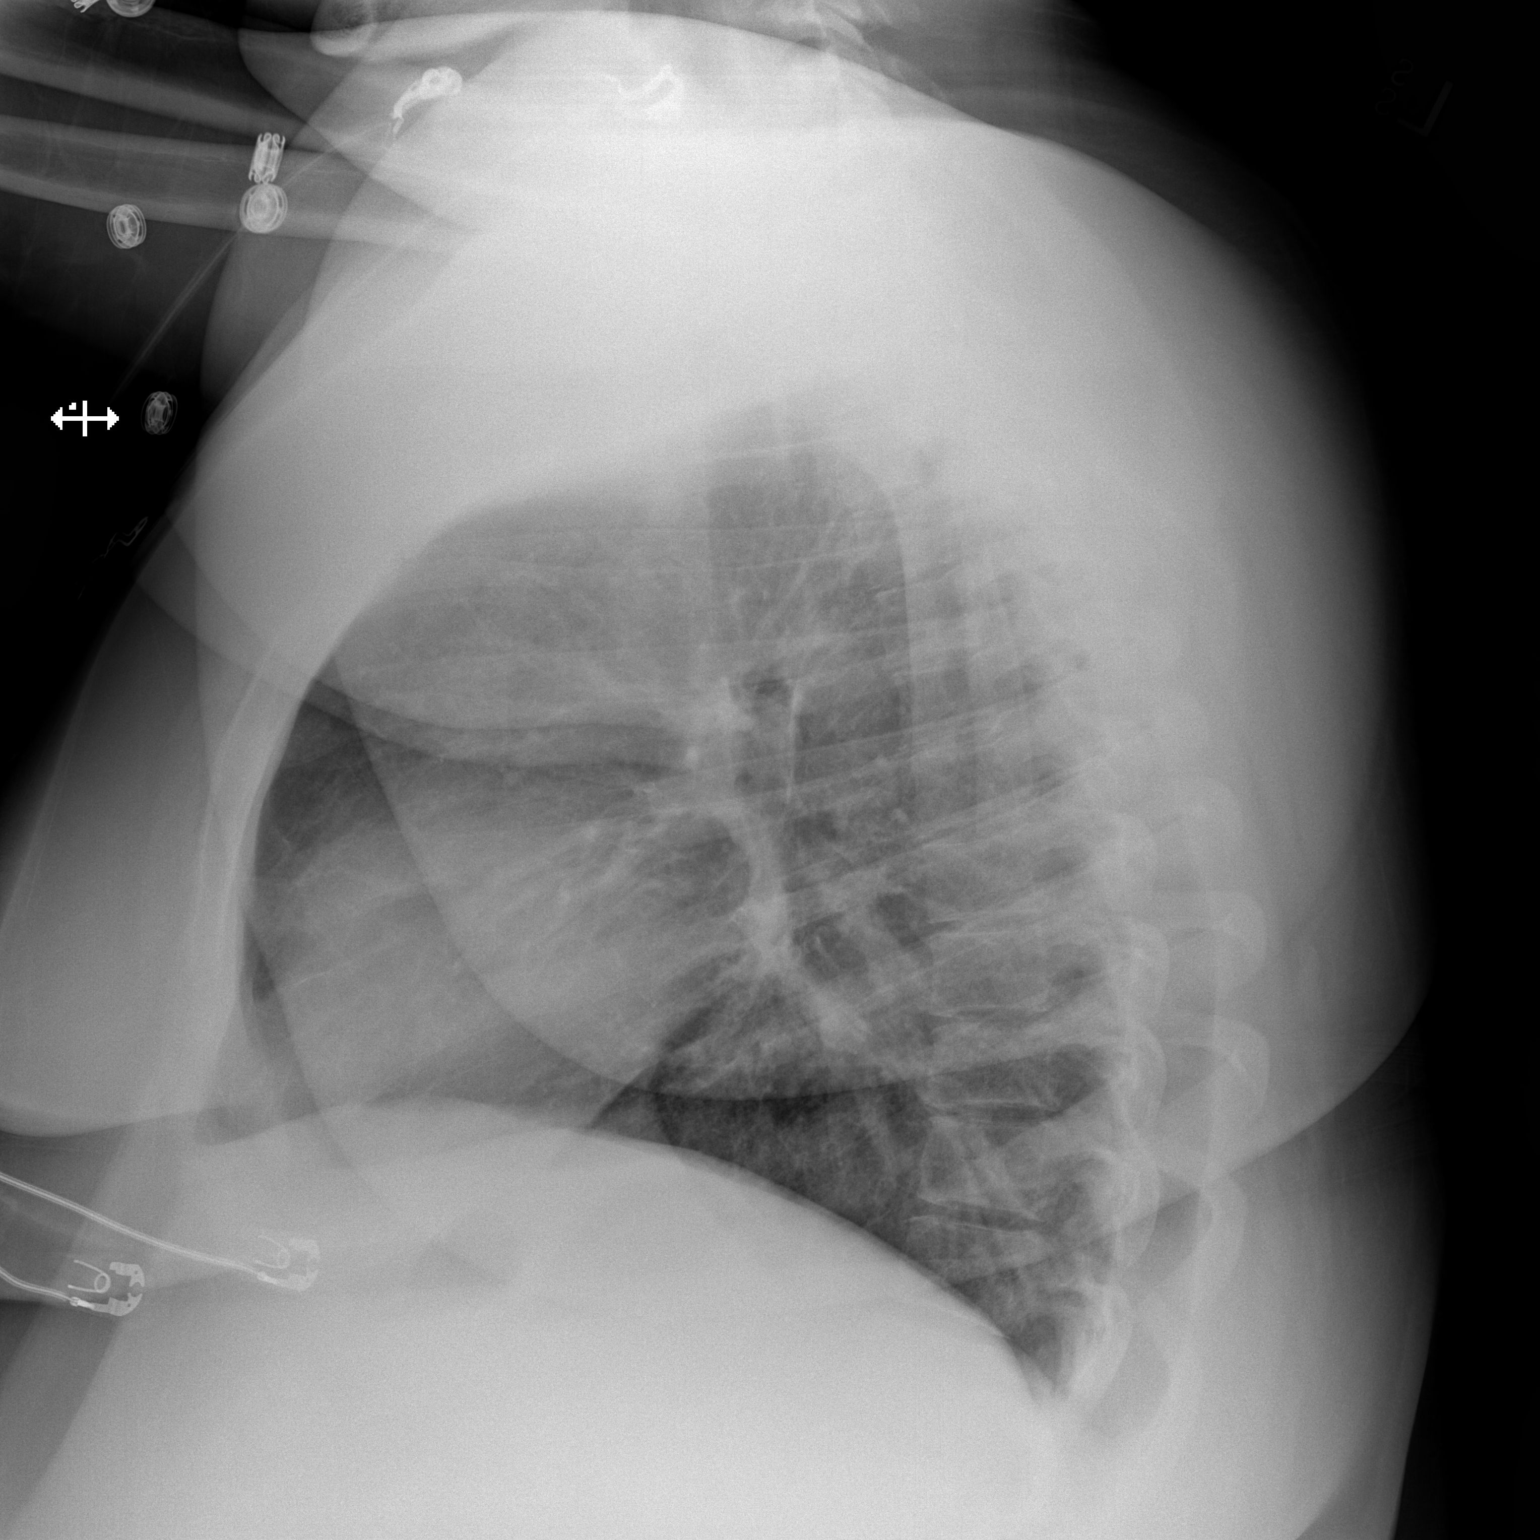

[2 of 2 positions shown; findings below may reference images not displayed]

FINDINGS: Cardiac and mediastinal silhouettes are stable in size and contour,
and remain within normal limits.

Lungs normally inflated. Streaky opacities with medial right upper
lobe seen, similar to previous, which may reflect atelectasis and/
or scarring. Superimposed persistent pneumonia not excluded. No
other focal airspace disease. No pulmonary edema or pleural
effusion. No pneumothorax.

No acute osseous abnormality.
IMPRESSION: Persistent streaky opacity within the medial right upper lobe, which
may reflect atelectasis and/ or scarring. Superimposed
bronchopneumonia is not excluded, and could be considered in the
correct clinical setting.

## 2017-12-24 IMAGING — US US OB TRANSVAGINAL
1 series · 14 of 28 positions shown · non-contrast
Comparison: None.

CLINICAL DATA: Acute onset of blood in the vulvar region. Initial
encounter.

EXAM:
TWIN OBSTETRIC <14WK US AND TRANSVAGINAL OB US

[Series 1: us ob transvaginal · 0.32mm/px · 58 acquisitions, 14 frames shown]
[im 3/58]
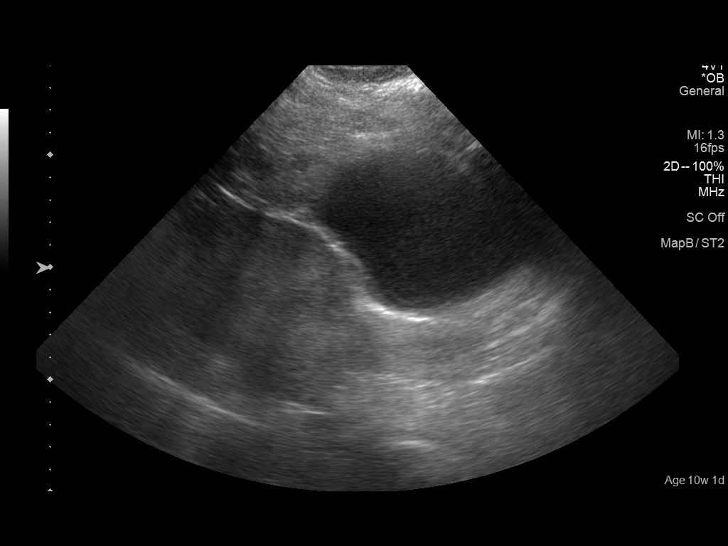
[im 7/58]
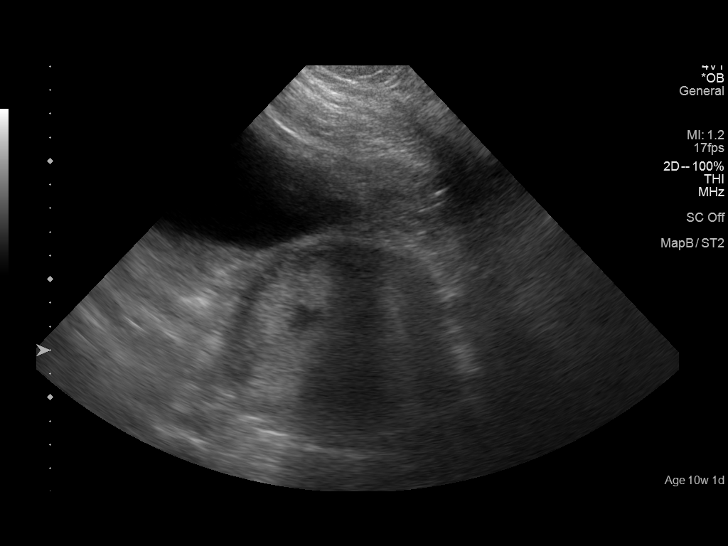
[im 11/58]
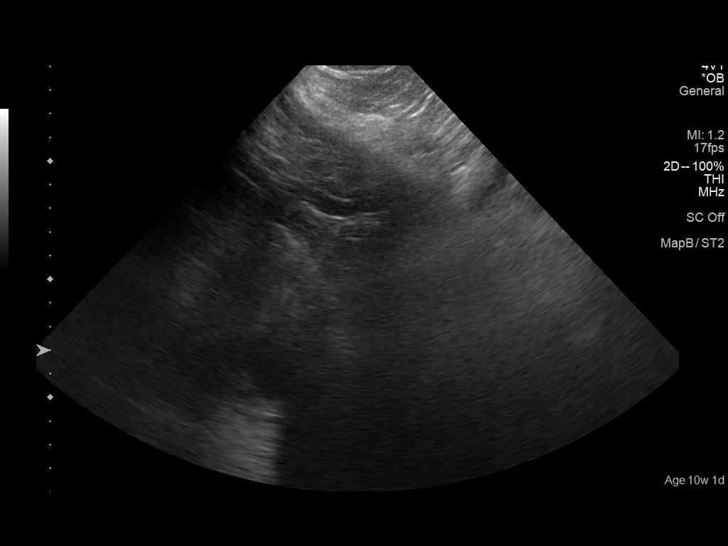
[im 15/58]
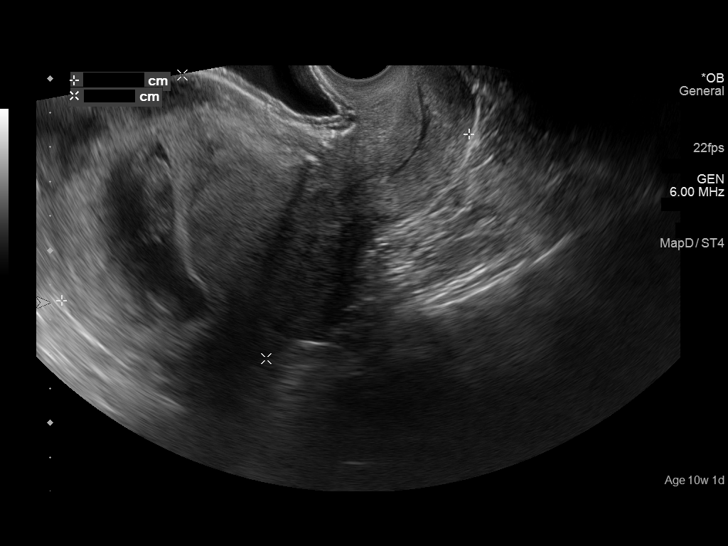
[im 20/58]
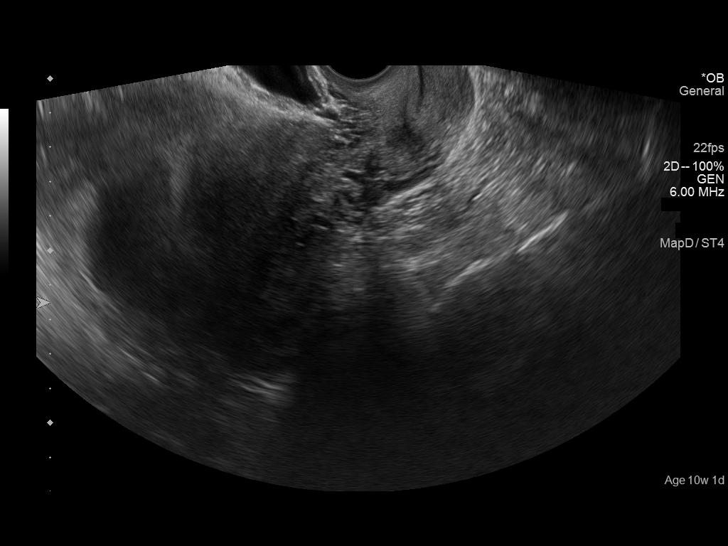
[im 24/58]
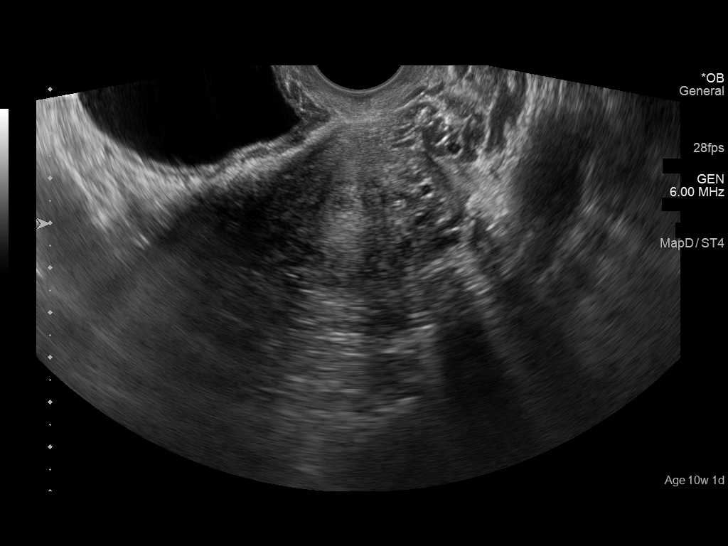
[im 28/58]
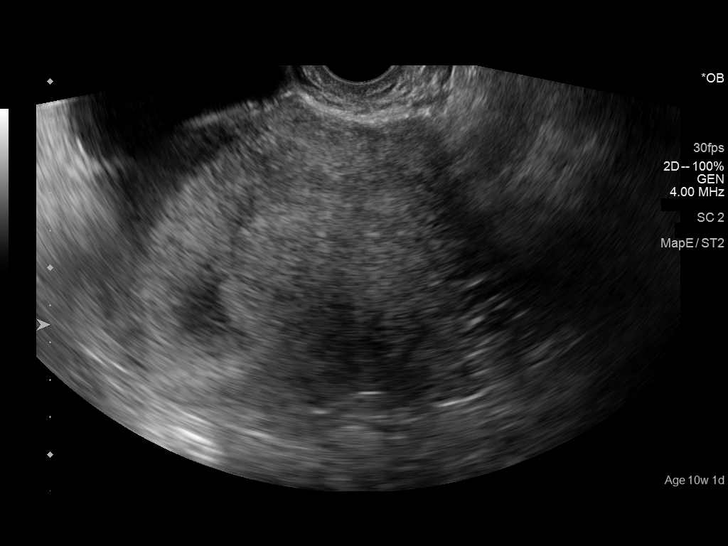
[im 32/58]
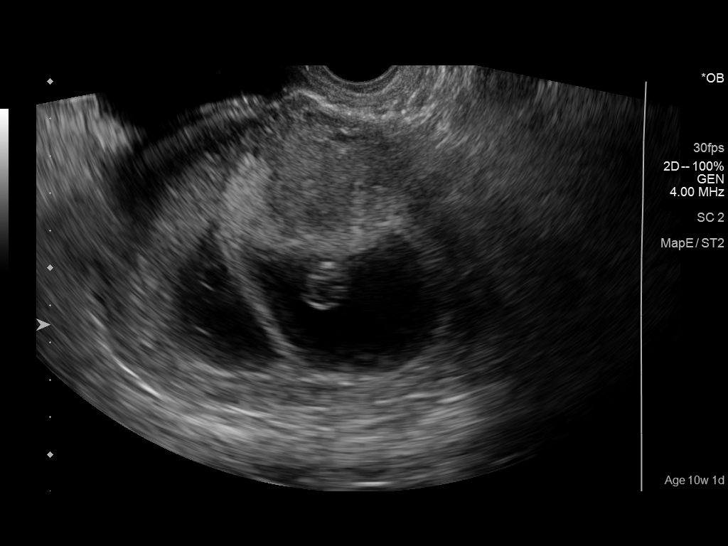
[im 36/58]
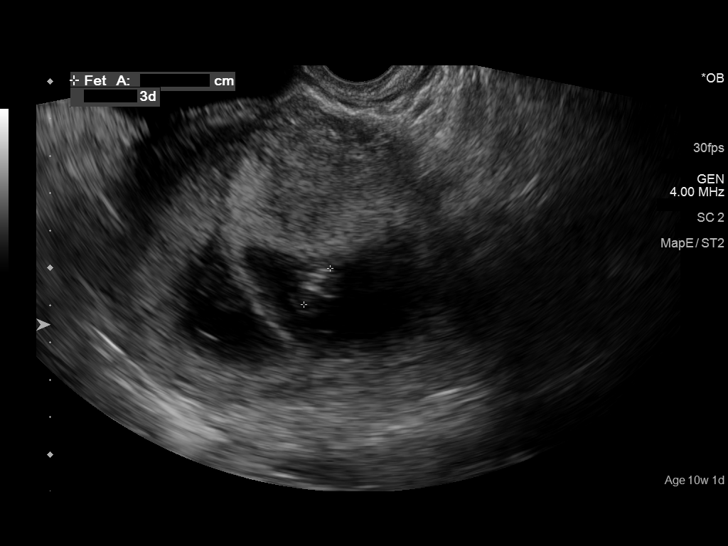
[im 41/58]
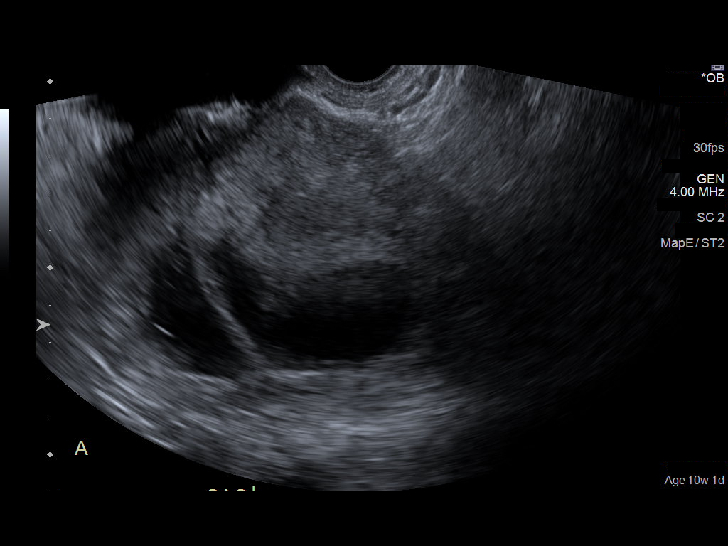
[im 45/58]
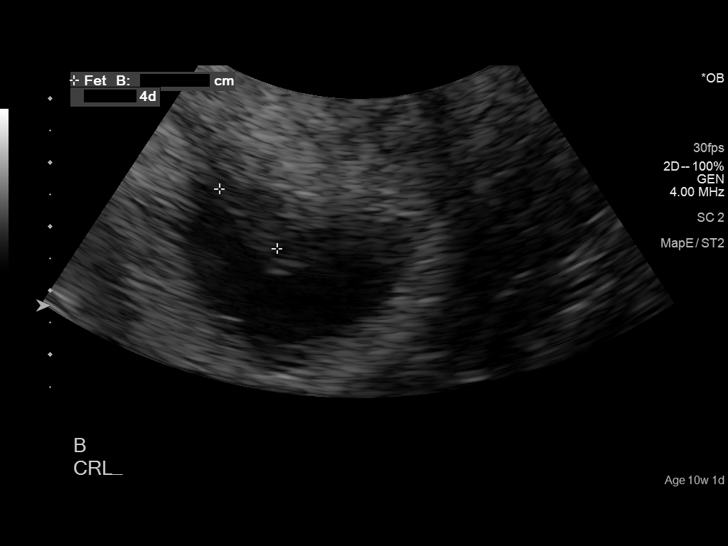
[im 49/58]
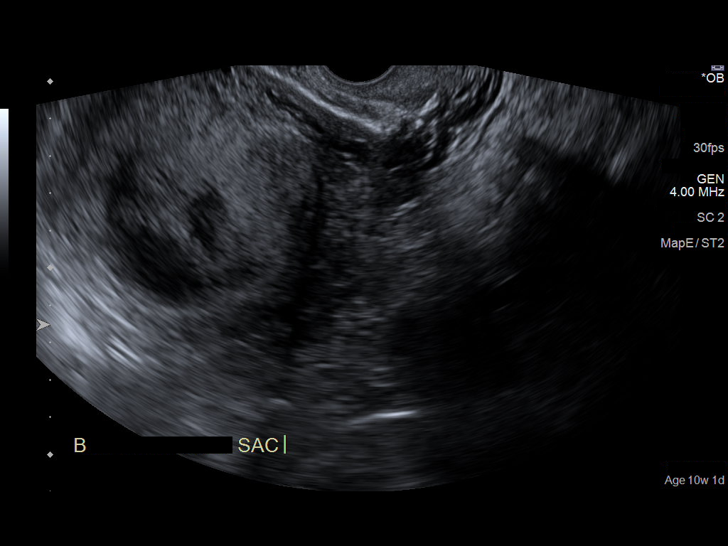
[im 53/58]
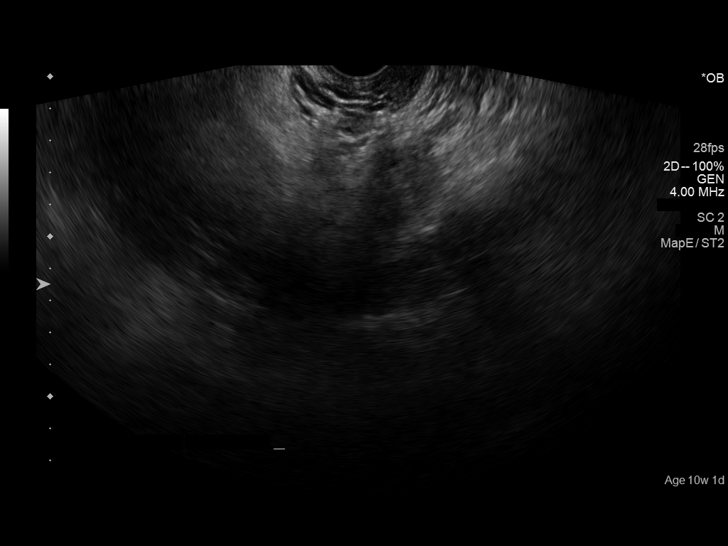
[im 58/58]
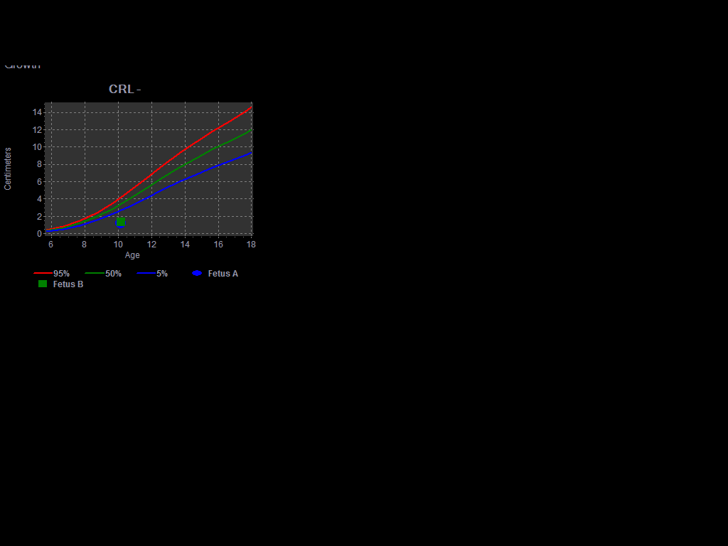

[14 of 28 positions shown; findings below may reference images not displayed]

FINDINGS: Number of IUPs:  2

Chorionicity/Amnionicity:  Dichorionic diamniotic

TWIN 1

Yolk sac:  No

Embryo:  Yes

Cardiac Activity: Yes

Heart Rate: 180 bpm

CRL:  12.3  mm   7 w 3 d                  US EDC: 09/10/2017

TWIN 2

Yolk sac:  No

Embryo:  Yes

Cardiac Activity: Yes

Heart Rate: 182 bpm

CRL:  13.8  mm   7 w 5 d                  US EDC: 09/08/2017

Subchorionic hemorrhage:  None visualized.

Maternal uterus/adnexae: The uterus is otherwise unremarkable in
appearance. Evaluation is suboptimal due to the patient's habitus.

The ovaries are not visualized on this study. No suspicious adnexal
masses are seen. No free fluid is seen in the pelvic cul-de-sac.
IMPRESSION: Live dichorionic diamniotic twin pregnancy, with crown-rump lengths
of 1.2 cm and 1.4 cm, reflecting an average gestational age of 7
weeks 4 days. This does not match the gestational age by LMP, and
reflects a new estimated date of delivery September 09, 2017.

## 2018-01-29 IMAGING — CR DG CHEST 2V
2 series · 2 of 2 positions shown · non-contrast
Comparison: 01/23/2017

CLINICAL DATA: Productive cough with bright red blood. Shortness of
breath.

EXAM:
CHEST  2 VIEW

[w chest pa]
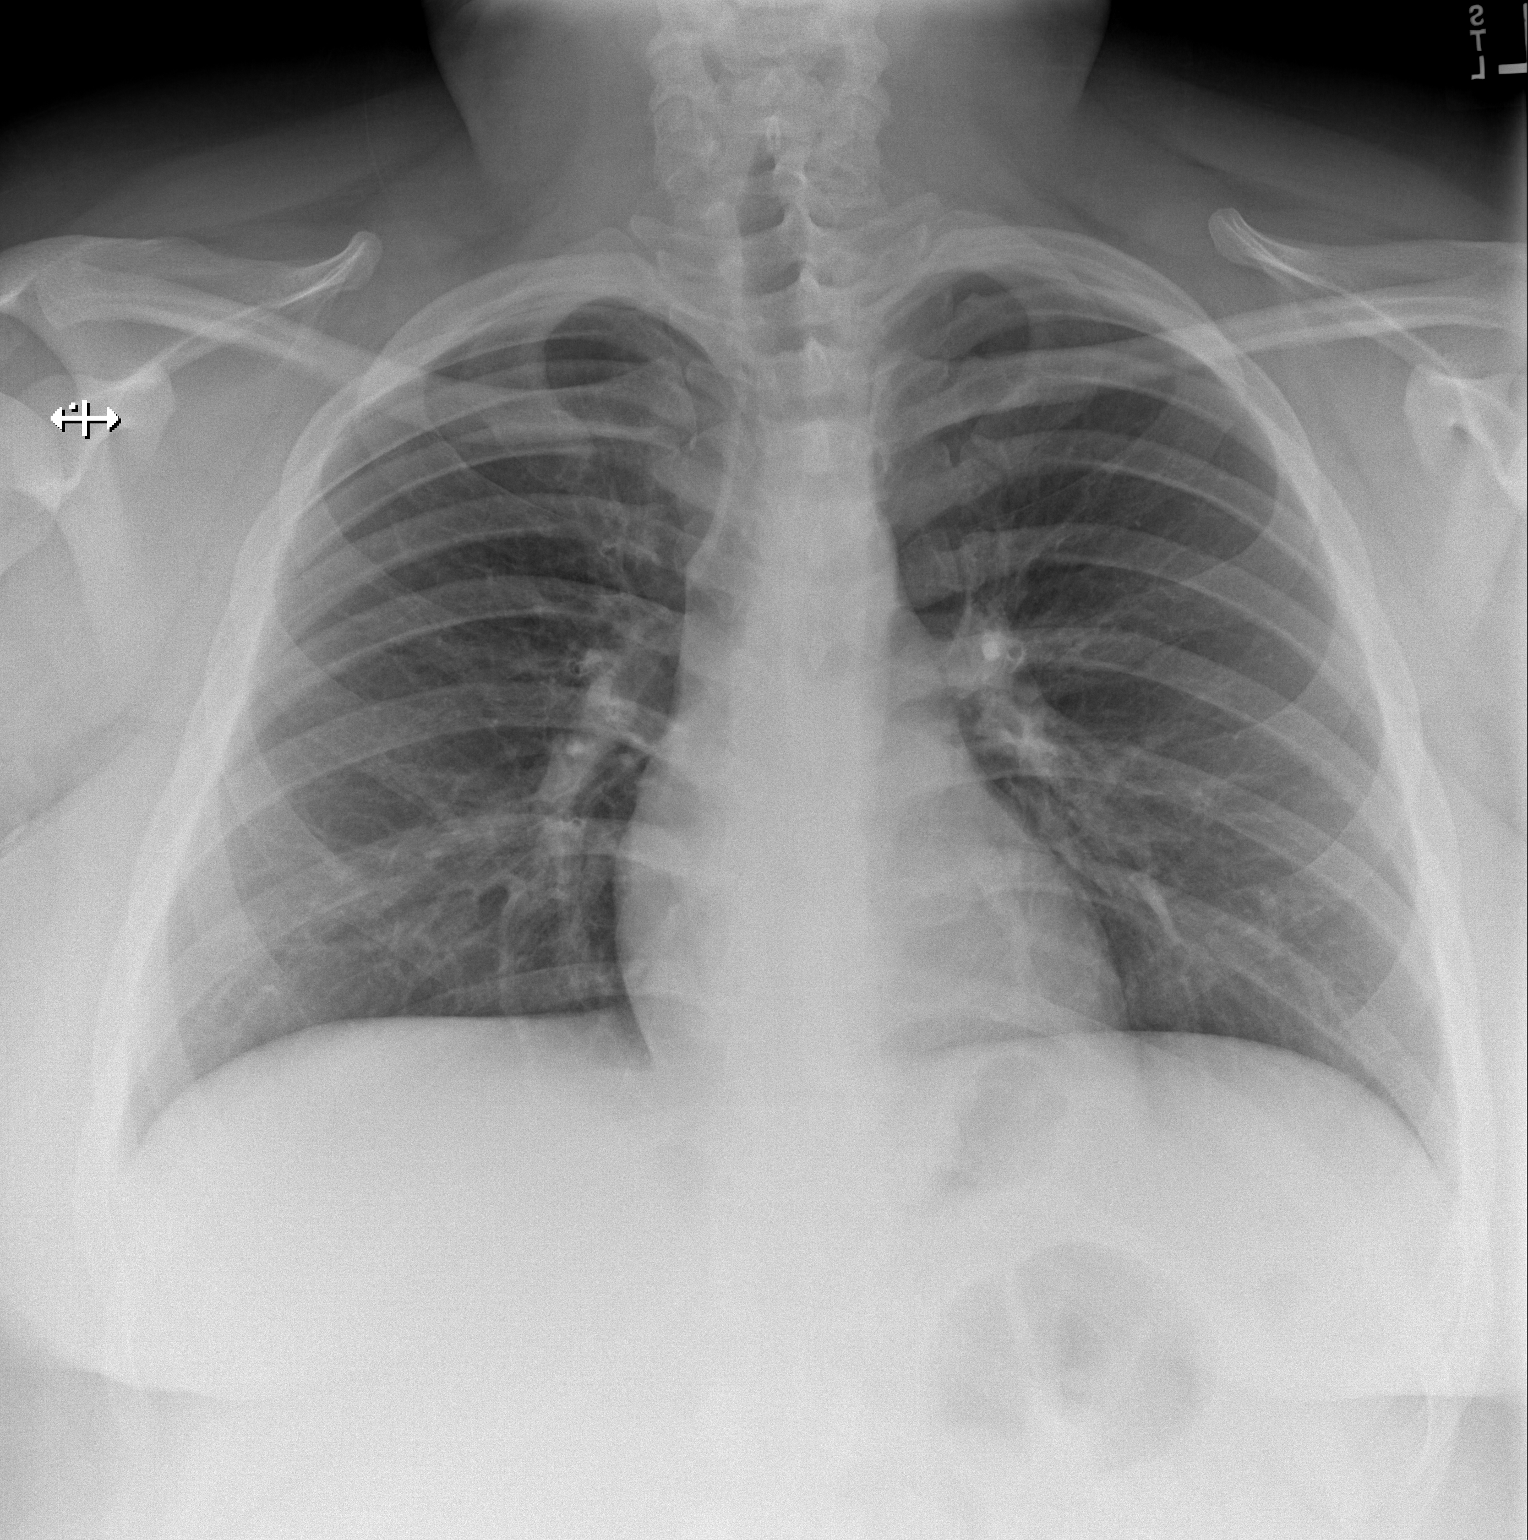

[w chest lat]
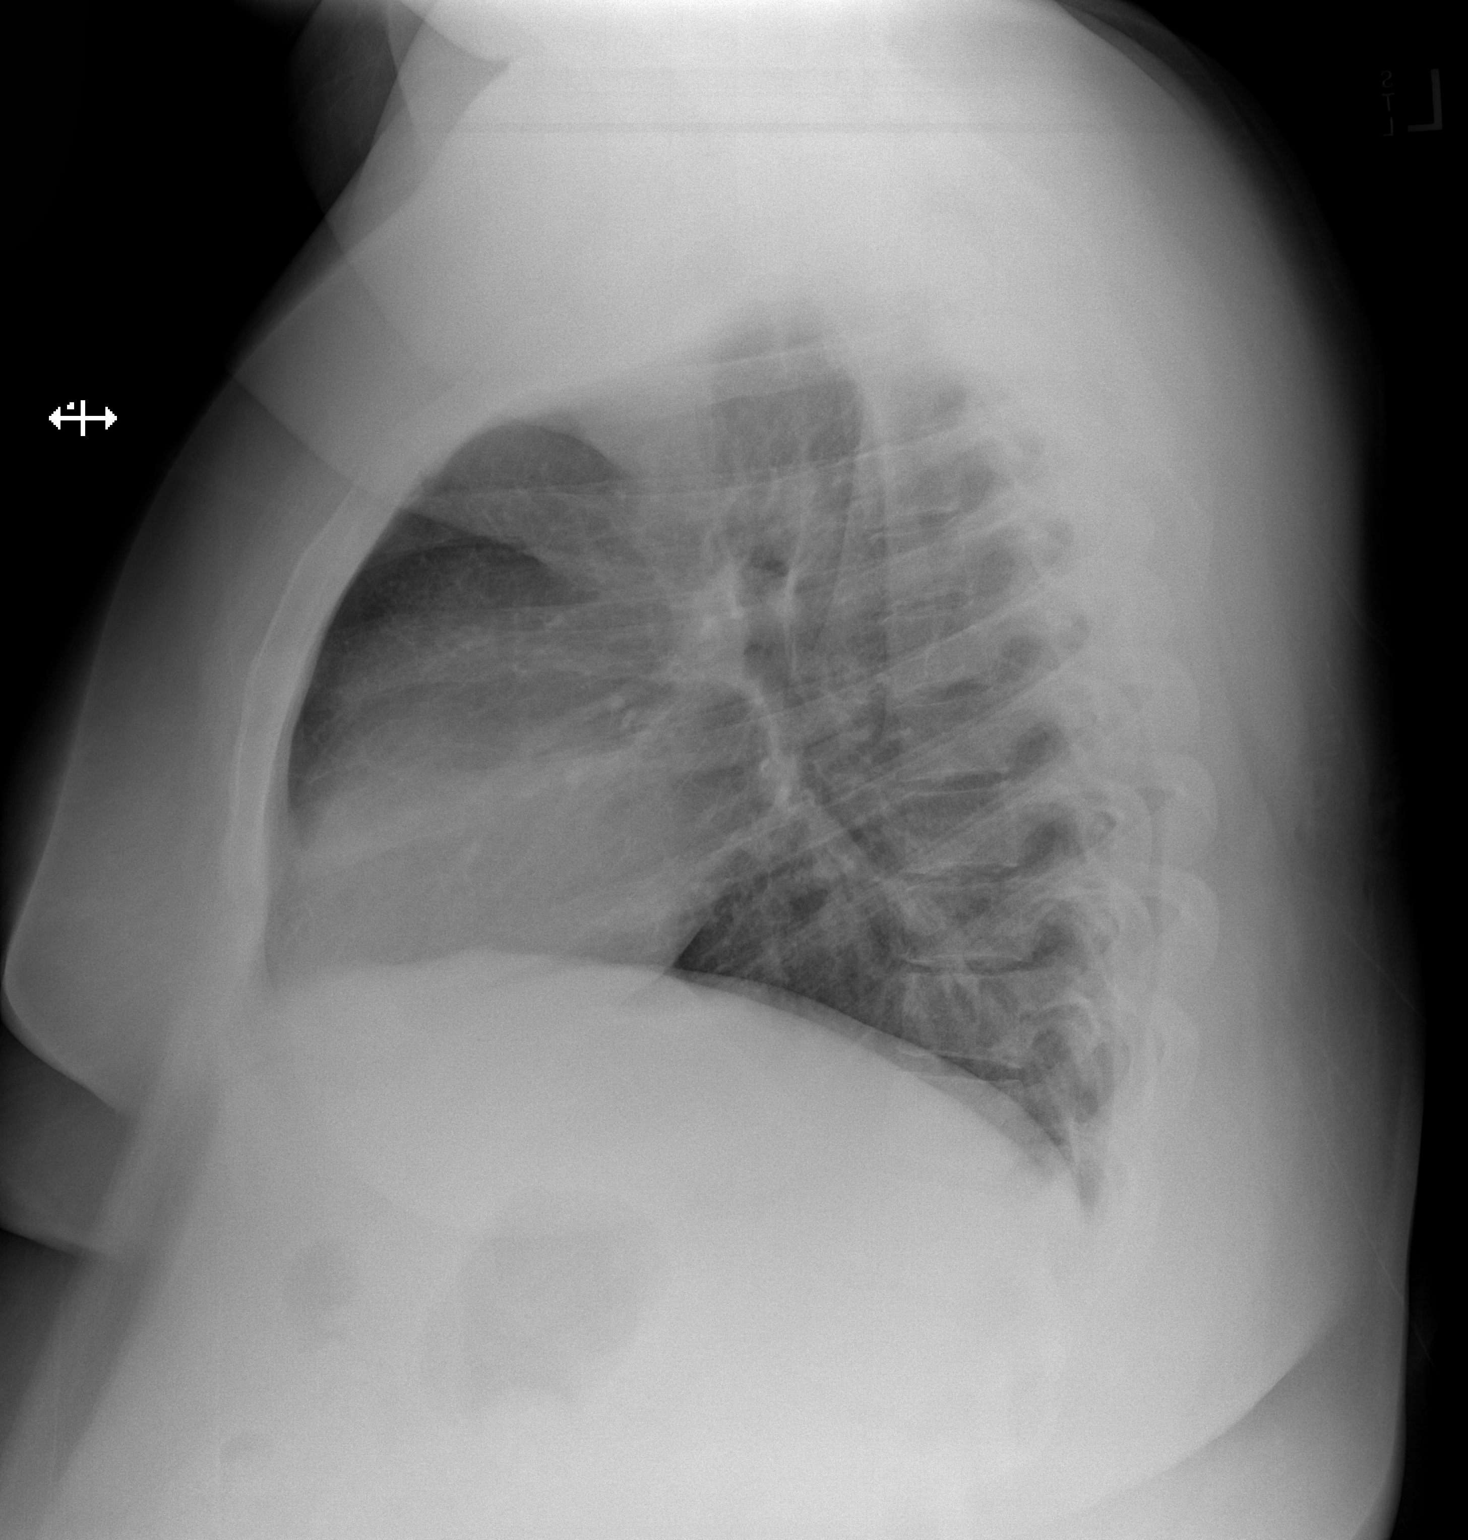

[2 of 2 positions shown; findings below may reference images not displayed]

FINDINGS: Both lungs are clear. Heart and mediastinum are within normal
limits. Negative for a pneumothorax. No pleural effusion. No acute
bone abnormality. Trachea is midline.
IMPRESSION: No active cardiopulmonary disease.

## 2019-02-18 NOTE — Telephone Encounter (Signed)
Error
